# Patient Record
Sex: Female | Born: 1935 | Race: White | Hispanic: No | Smoking: Never smoker
Health system: Southern US, Community
[De-identification: ages and names within clinical notes are randomized; demographics above are authoritative.]

## PROBLEM LIST (undated history)

## (undated) DIAGNOSIS — E039 Hypothyroidism, unspecified: Secondary | ICD-10-CM

## (undated) DIAGNOSIS — I10 Essential (primary) hypertension: Secondary | ICD-10-CM

## (undated) HISTORY — PX: ABDOMINAL HYSTERECTOMY: SHX81

---

## 1998-05-02 ENCOUNTER — Encounter: Payer: Self-pay | Admitting: Obstetrics and Gynecology

## 1998-05-06 ENCOUNTER — Inpatient Hospital Stay (HOSPITAL_COMMUNITY): Admission: RE | Admit: 1998-05-06 | Discharge: 1998-05-09 | Payer: Self-pay | Admitting: Obstetrics and Gynecology

## 1999-10-20 ENCOUNTER — Encounter: Payer: Self-pay | Admitting: Internal Medicine

## 1999-10-20 ENCOUNTER — Encounter: Admission: RE | Admit: 1999-10-20 | Discharge: 1999-10-20 | Payer: Self-pay | Admitting: Internal Medicine

## 2000-10-20 ENCOUNTER — Encounter: Admission: RE | Admit: 2000-10-20 | Discharge: 2000-10-20 | Payer: Self-pay | Admitting: Internal Medicine

## 2000-10-20 ENCOUNTER — Encounter: Payer: Self-pay | Admitting: Internal Medicine

## 2001-10-24 ENCOUNTER — Encounter: Admission: RE | Admit: 2001-10-24 | Discharge: 2001-10-24 | Payer: Self-pay | Admitting: Internal Medicine

## 2001-10-24 ENCOUNTER — Encounter: Payer: Self-pay | Admitting: Internal Medicine

## 2002-10-26 ENCOUNTER — Encounter: Payer: Self-pay | Admitting: Internal Medicine

## 2002-10-26 ENCOUNTER — Encounter: Admission: RE | Admit: 2002-10-26 | Discharge: 2002-10-26 | Payer: Self-pay | Admitting: Internal Medicine

## 2003-04-17 ENCOUNTER — Encounter: Admission: RE | Admit: 2003-04-17 | Discharge: 2003-04-17 | Payer: Self-pay | Admitting: Internal Medicine

## 2003-11-01 ENCOUNTER — Encounter: Admission: RE | Admit: 2003-11-01 | Discharge: 2003-11-01 | Payer: Self-pay | Admitting: Internal Medicine

## 2004-11-09 ENCOUNTER — Encounter: Admission: RE | Admit: 2004-11-09 | Discharge: 2004-11-09 | Payer: Self-pay | Admitting: Internal Medicine

## 2004-11-18 ENCOUNTER — Encounter: Admission: RE | Admit: 2004-11-18 | Discharge: 2004-11-18 | Payer: Self-pay | Admitting: Internal Medicine

## 2005-04-19 ENCOUNTER — Encounter: Admission: RE | Admit: 2005-04-19 | Discharge: 2005-04-19 | Payer: Self-pay | Admitting: Internal Medicine

## 2005-11-17 ENCOUNTER — Encounter: Admission: RE | Admit: 2005-11-17 | Discharge: 2005-11-17 | Payer: Self-pay | Admitting: Internal Medicine

## 2006-11-21 ENCOUNTER — Encounter: Admission: RE | Admit: 2006-11-21 | Discharge: 2006-11-21 | Payer: Self-pay | Admitting: Internal Medicine

## 2007-01-31 ENCOUNTER — Encounter: Admission: RE | Admit: 2007-01-31 | Discharge: 2007-01-31 | Payer: Self-pay | Admitting: Internal Medicine

## 2007-06-05 ENCOUNTER — Encounter: Admission: RE | Admit: 2007-06-05 | Discharge: 2007-06-05 | Payer: Self-pay | Admitting: Internal Medicine

## 2007-07-20 ENCOUNTER — Encounter: Admission: RE | Admit: 2007-07-20 | Discharge: 2007-07-20 | Payer: Self-pay | Admitting: Internal Medicine

## 2007-09-04 ENCOUNTER — Encounter: Admission: RE | Admit: 2007-09-04 | Discharge: 2007-09-04 | Payer: Self-pay | Admitting: Internal Medicine

## 2007-11-21 ENCOUNTER — Encounter: Admission: RE | Admit: 2007-11-21 | Discharge: 2007-11-21 | Payer: Self-pay | Admitting: Internal Medicine

## 2008-11-22 ENCOUNTER — Encounter: Admission: RE | Admit: 2008-11-22 | Discharge: 2008-11-22 | Payer: Self-pay | Admitting: Internal Medicine

## 2009-05-12 ENCOUNTER — Ambulatory Visit (HOSPITAL_BASED_OUTPATIENT_CLINIC_OR_DEPARTMENT_OTHER): Admission: RE | Admit: 2009-05-12 | Discharge: 2009-05-13 | Payer: Self-pay | Admitting: Urology

## 2009-11-26 ENCOUNTER — Encounter: Admission: RE | Admit: 2009-11-26 | Discharge: 2009-11-26 | Payer: Self-pay | Admitting: Internal Medicine

## 2010-09-07 IMAGING — CR DG CHEST 2V
2 series · 2 of 2 positions shown · non-contrast
Comparison: Chest 09/04/2007.

CLINICAL DATA: Preoperative respiratory film for patient with
pelvic prolapse.

CHEST - 2 VIEW

[w chest pa]
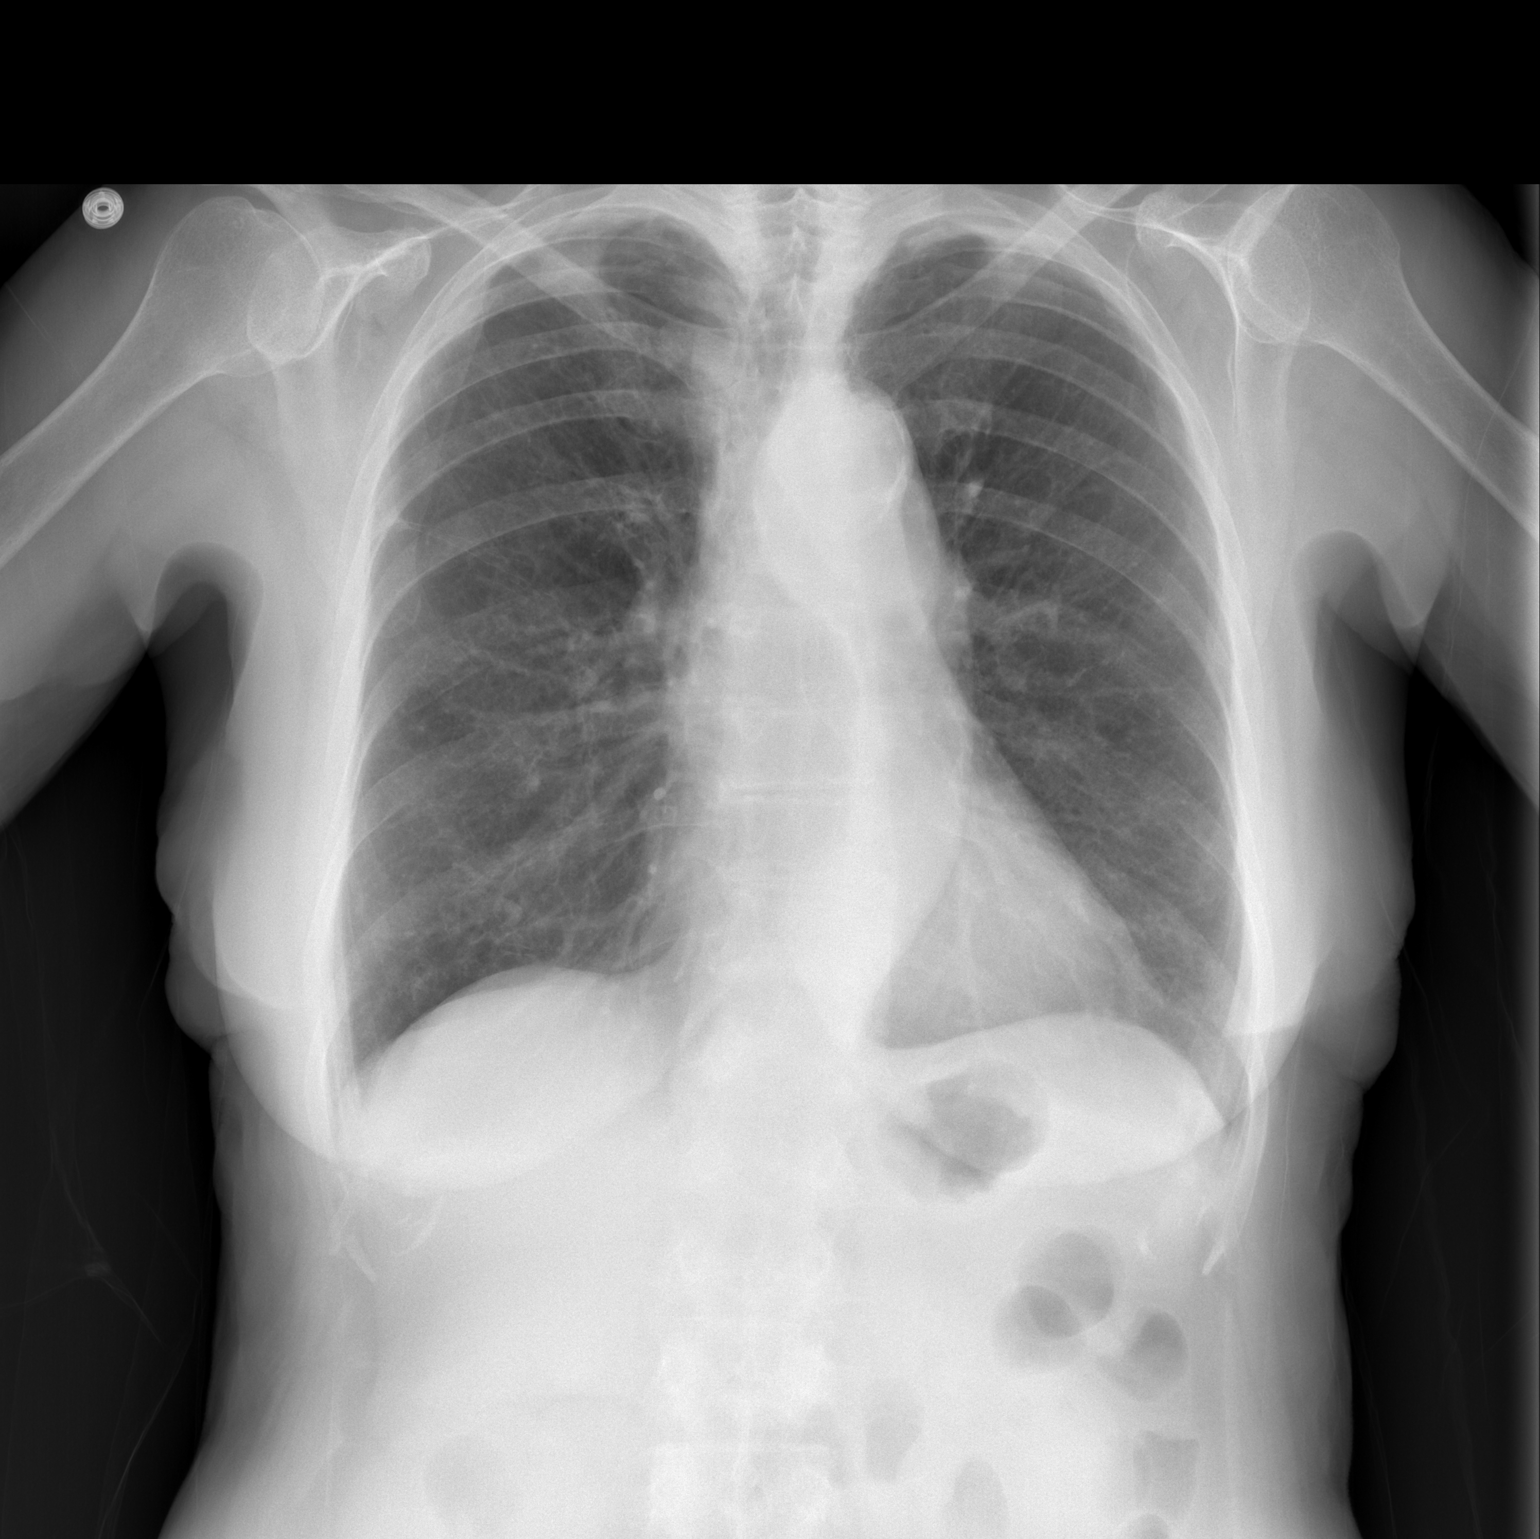

[w chest lat]
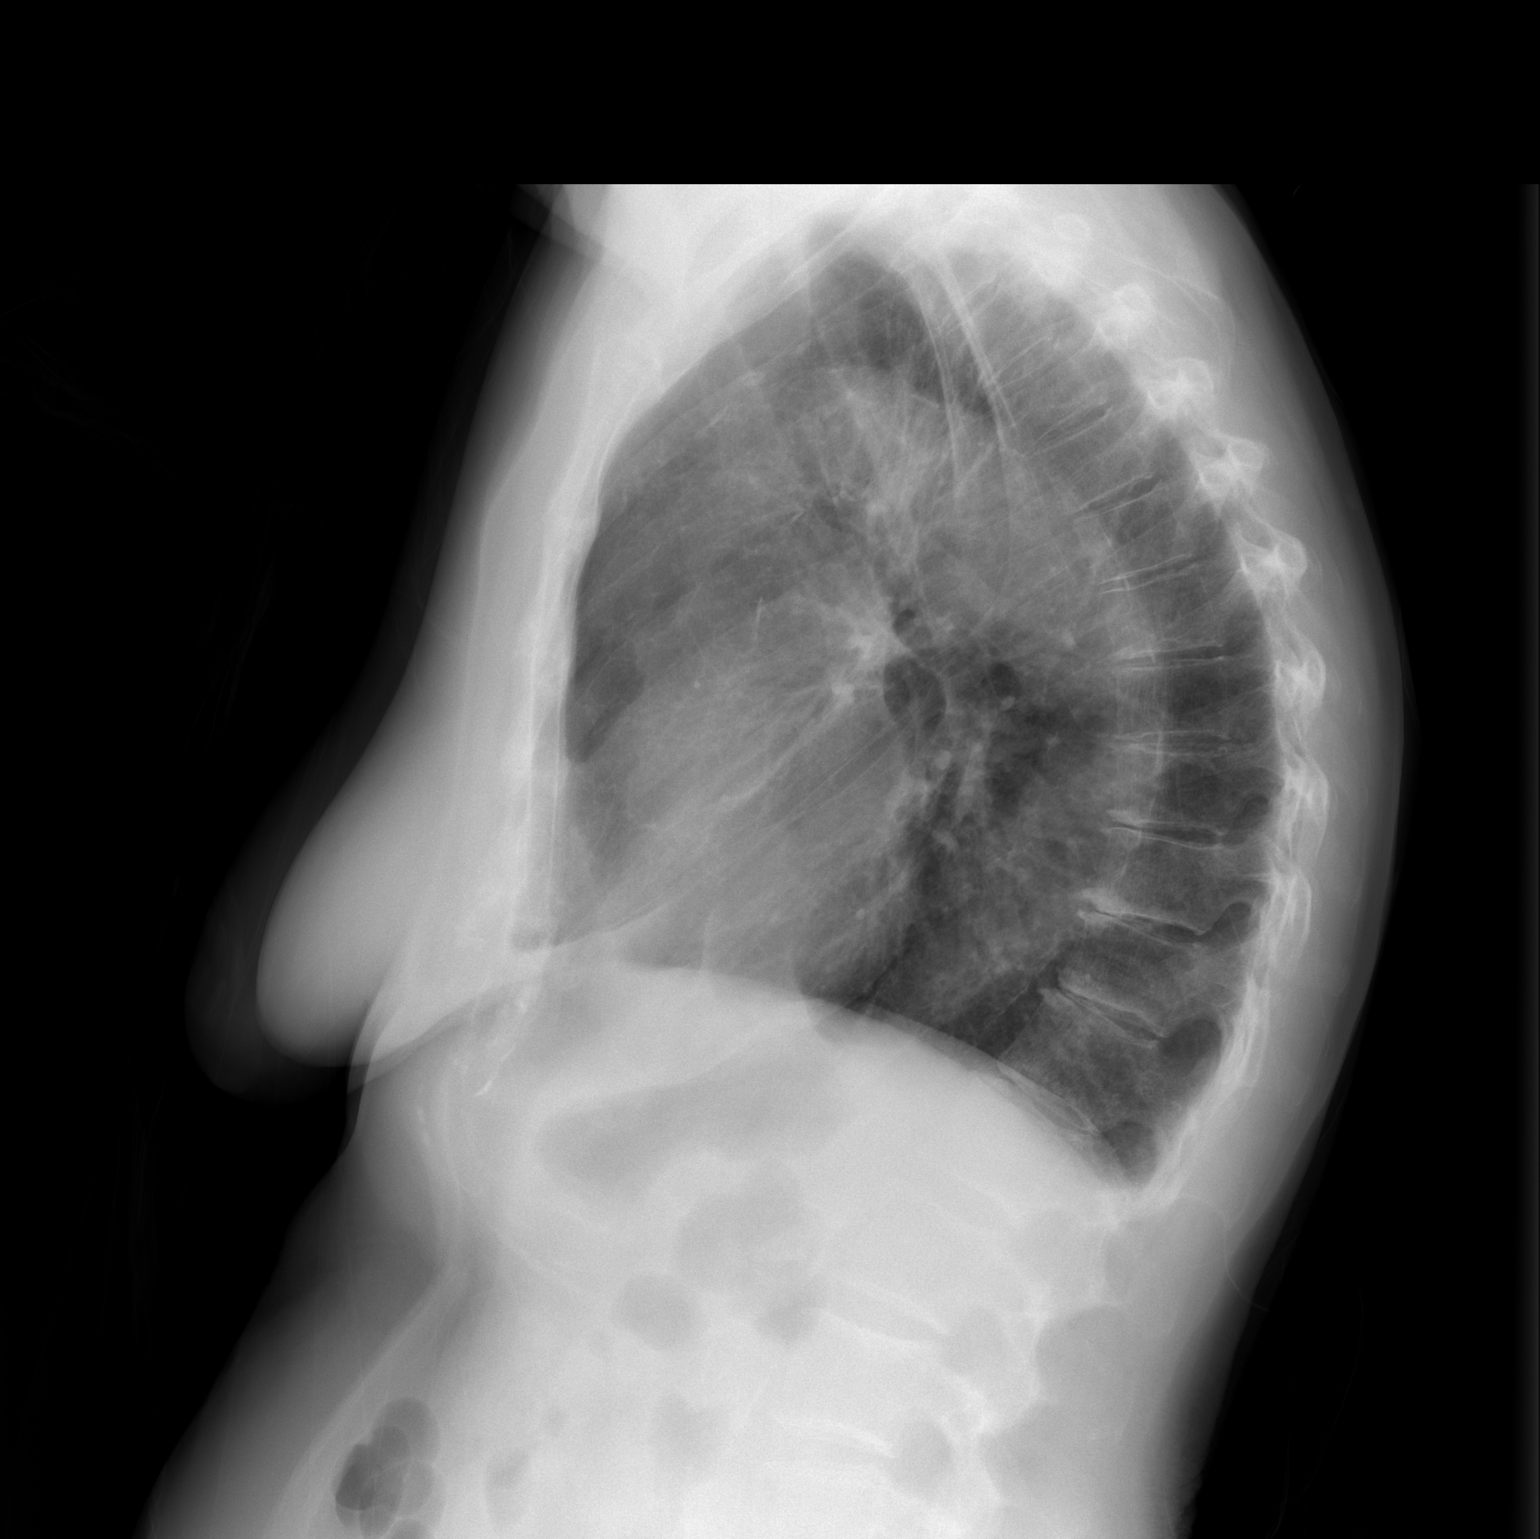

[2 of 2 positions shown; findings below may reference images not displayed]

FINDINGS: There is some biapical pleural and parenchymal scarring
and a small scar in the right upper lobe.  Lungs otherwise clear.
No effusion.  Heart size normal.
IMPRESSION: No acute disease.  Stable compared to prior exam.

## 2010-11-03 ENCOUNTER — Other Ambulatory Visit: Payer: Self-pay | Admitting: Internal Medicine

## 2010-11-03 DIAGNOSIS — Z1231 Encounter for screening mammogram for malignant neoplasm of breast: Secondary | ICD-10-CM

## 2010-11-26 ENCOUNTER — Other Ambulatory Visit: Payer: Self-pay | Admitting: Internal Medicine

## 2010-11-26 DIAGNOSIS — Z780 Asymptomatic menopausal state: Secondary | ICD-10-CM

## 2010-12-02 ENCOUNTER — Ambulatory Visit
Admission: RE | Admit: 2010-12-02 | Discharge: 2010-12-02 | Disposition: A | Discharge status: Home / Self Care | Payer: Medicare Other | Source: Ambulatory Visit | Attending: Internal Medicine | Admitting: Internal Medicine

## 2010-12-02 DIAGNOSIS — Z1231 Encounter for screening mammogram for malignant neoplasm of breast: Secondary | ICD-10-CM

## 2010-12-02 DIAGNOSIS — Z780 Asymptomatic menopausal state: Secondary | ICD-10-CM

## 2011-11-02 ENCOUNTER — Other Ambulatory Visit: Payer: Self-pay | Admitting: Internal Medicine

## 2011-11-02 DIAGNOSIS — Z1231 Encounter for screening mammogram for malignant neoplasm of breast: Secondary | ICD-10-CM

## 2011-12-06 ENCOUNTER — Ambulatory Visit
Admission: RE | Admit: 2011-12-06 | Discharge: 2011-12-06 | Disposition: A | Discharge status: Home / Self Care | Payer: Medicare Other | Source: Ambulatory Visit | Attending: Internal Medicine | Admitting: Internal Medicine

## 2011-12-06 DIAGNOSIS — Z1231 Encounter for screening mammogram for malignant neoplasm of breast: Secondary | ICD-10-CM

## 2012-10-30 ENCOUNTER — Other Ambulatory Visit: Payer: Self-pay

## 2012-10-30 DIAGNOSIS — Z1231 Encounter for screening mammogram for malignant neoplasm of breast: Secondary | ICD-10-CM

## 2012-12-07 ENCOUNTER — Ambulatory Visit
Admission: RE | Admit: 2012-12-07 | Discharge: 2012-12-07 | Disposition: A | Discharge status: Home / Self Care | Payer: Medicare Other | Source: Ambulatory Visit

## 2012-12-07 DIAGNOSIS — Z1231 Encounter for screening mammogram for malignant neoplasm of breast: Secondary | ICD-10-CM

## 2013-10-31 ENCOUNTER — Other Ambulatory Visit: Payer: Self-pay

## 2013-10-31 DIAGNOSIS — Z1231 Encounter for screening mammogram for malignant neoplasm of breast: Secondary | ICD-10-CM

## 2013-12-10 ENCOUNTER — Ambulatory Visit
Admission: RE | Admit: 2013-12-10 | Discharge: 2013-12-10 | Disposition: A | Discharge status: Home / Self Care | Payer: Medicare Other | Source: Ambulatory Visit

## 2013-12-10 DIAGNOSIS — Z1231 Encounter for screening mammogram for malignant neoplasm of breast: Secondary | ICD-10-CM

## 2015-07-16 DIAGNOSIS — H35031 Hypertensive retinopathy, right eye: Secondary | ICD-10-CM | POA: Diagnosis not present

## 2015-07-16 DIAGNOSIS — H35361 Drusen (degenerative) of macula, right eye: Secondary | ICD-10-CM | POA: Diagnosis not present

## 2015-07-16 DIAGNOSIS — H34811 Central retinal vein occlusion, right eye, with macular edema: Secondary | ICD-10-CM | POA: Diagnosis not present

## 2015-07-29 DIAGNOSIS — L821 Other seborrheic keratosis: Secondary | ICD-10-CM | POA: Diagnosis not present

## 2015-07-29 DIAGNOSIS — Z85828 Personal history of other malignant neoplasm of skin: Secondary | ICD-10-CM | POA: Diagnosis not present

## 2015-07-29 DIAGNOSIS — L570 Actinic keratosis: Secondary | ICD-10-CM | POA: Diagnosis not present

## 2015-09-22 DIAGNOSIS — H1851 Endothelial corneal dystrophy: Secondary | ICD-10-CM | POA: Diagnosis not present

## 2015-09-22 DIAGNOSIS — H40013 Open angle with borderline findings, low risk, bilateral: Secondary | ICD-10-CM | POA: Diagnosis not present

## 2015-09-22 DIAGNOSIS — H348122 Central retinal vein occlusion, left eye, stable: Secondary | ICD-10-CM | POA: Diagnosis not present

## 2015-09-23 DIAGNOSIS — I8310 Varicose veins of unspecified lower extremity with inflammation: Secondary | ICD-10-CM | POA: Diagnosis not present

## 2015-09-23 DIAGNOSIS — Z85828 Personal history of other malignant neoplasm of skin: Secondary | ICD-10-CM | POA: Diagnosis not present

## 2015-09-23 DIAGNOSIS — L570 Actinic keratosis: Secondary | ICD-10-CM | POA: Diagnosis not present

## 2015-09-23 DIAGNOSIS — L821 Other seborrheic keratosis: Secondary | ICD-10-CM | POA: Diagnosis not present

## 2015-11-26 DIAGNOSIS — H34811 Central retinal vein occlusion, right eye, with macular edema: Secondary | ICD-10-CM | POA: Diagnosis not present

## 2015-12-09 DIAGNOSIS — H34811 Central retinal vein occlusion, right eye, with macular edema: Secondary | ICD-10-CM | POA: Diagnosis not present

## 2016-02-03 DIAGNOSIS — E038 Other specified hypothyroidism: Secondary | ICD-10-CM | POA: Diagnosis not present

## 2016-02-03 DIAGNOSIS — I10 Essential (primary) hypertension: Secondary | ICD-10-CM | POA: Diagnosis not present

## 2016-02-03 DIAGNOSIS — E784 Other hyperlipidemia: Secondary | ICD-10-CM | POA: Diagnosis not present

## 2016-02-03 DIAGNOSIS — N390 Urinary tract infection, site not specified: Secondary | ICD-10-CM | POA: Diagnosis not present

## 2016-02-03 DIAGNOSIS — M8580 Other specified disorders of bone density and structure, unspecified site: Secondary | ICD-10-CM | POA: Diagnosis not present

## 2016-02-04 DIAGNOSIS — H34811 Central retinal vein occlusion, right eye, with macular edema: Secondary | ICD-10-CM | POA: Diagnosis not present

## 2016-02-04 DIAGNOSIS — H35361 Drusen (degenerative) of macula, right eye: Secondary | ICD-10-CM | POA: Diagnosis not present

## 2016-02-04 DIAGNOSIS — H35421 Microcystoid degeneration of retina, right eye: Secondary | ICD-10-CM | POA: Diagnosis not present

## 2016-02-04 DIAGNOSIS — H43813 Vitreous degeneration, bilateral: Secondary | ICD-10-CM | POA: Diagnosis not present

## 2016-02-11 DIAGNOSIS — E038 Other specified hypothyroidism: Secondary | ICD-10-CM | POA: Diagnosis not present

## 2016-02-11 DIAGNOSIS — M8580 Other specified disorders of bone density and structure, unspecified site: Secondary | ICD-10-CM | POA: Diagnosis not present

## 2016-02-11 DIAGNOSIS — Z0000 Encounter for general adult medical examination without abnormal findings: Secondary | ICD-10-CM | POA: Diagnosis not present

## 2016-02-11 DIAGNOSIS — J342 Deviated nasal septum: Secondary | ICD-10-CM | POA: Diagnosis not present

## 2016-02-11 DIAGNOSIS — I10 Essential (primary) hypertension: Secondary | ICD-10-CM | POA: Diagnosis not present

## 2016-02-11 DIAGNOSIS — I639 Cerebral infarction, unspecified: Secondary | ICD-10-CM | POA: Diagnosis not present

## 2016-02-11 DIAGNOSIS — K589 Irritable bowel syndrome without diarrhea: Secondary | ICD-10-CM | POA: Diagnosis not present

## 2016-02-11 DIAGNOSIS — Z1389 Encounter for screening for other disorder: Secondary | ICD-10-CM | POA: Diagnosis not present

## 2016-02-11 DIAGNOSIS — Z6821 Body mass index (BMI) 21.0-21.9, adult: Secondary | ICD-10-CM | POA: Diagnosis not present

## 2016-02-11 DIAGNOSIS — E784 Other hyperlipidemia: Secondary | ICD-10-CM | POA: Diagnosis not present

## 2016-02-16 DIAGNOSIS — H40013 Open angle with borderline findings, low risk, bilateral: Secondary | ICD-10-CM | POA: Diagnosis not present

## 2016-02-16 DIAGNOSIS — H1851 Endothelial corneal dystrophy: Secondary | ICD-10-CM | POA: Diagnosis not present

## 2016-02-16 DIAGNOSIS — H348122 Central retinal vein occlusion, left eye, stable: Secondary | ICD-10-CM | POA: Diagnosis not present

## 2016-04-13 DIAGNOSIS — H35421 Microcystoid degeneration of retina, right eye: Secondary | ICD-10-CM | POA: Diagnosis not present

## 2016-04-13 DIAGNOSIS — H35361 Drusen (degenerative) of macula, right eye: Secondary | ICD-10-CM | POA: Diagnosis not present

## 2016-04-13 DIAGNOSIS — H43813 Vitreous degeneration, bilateral: Secondary | ICD-10-CM | POA: Diagnosis not present

## 2016-04-13 DIAGNOSIS — H34811 Central retinal vein occlusion, right eye, with macular edema: Secondary | ICD-10-CM | POA: Diagnosis not present

## 2016-07-07 DIAGNOSIS — H43813 Vitreous degeneration, bilateral: Secondary | ICD-10-CM | POA: Diagnosis not present

## 2016-07-07 DIAGNOSIS — H34812 Central retinal vein occlusion, left eye, with macular edema: Secondary | ICD-10-CM | POA: Diagnosis not present

## 2016-07-07 DIAGNOSIS — H35361 Drusen (degenerative) of macula, right eye: Secondary | ICD-10-CM | POA: Diagnosis not present

## 2016-07-07 DIAGNOSIS — H3562 Retinal hemorrhage, left eye: Secondary | ICD-10-CM | POA: Diagnosis not present

## 2016-08-09 DIAGNOSIS — H348122 Central retinal vein occlusion, left eye, stable: Secondary | ICD-10-CM | POA: Diagnosis not present

## 2016-08-09 DIAGNOSIS — H1851 Endothelial corneal dystrophy: Secondary | ICD-10-CM | POA: Diagnosis not present

## 2016-08-09 DIAGNOSIS — H40013 Open angle with borderline findings, low risk, bilateral: Secondary | ICD-10-CM | POA: Diagnosis not present

## 2016-08-09 DIAGNOSIS — H524 Presbyopia: Secondary | ICD-10-CM | POA: Diagnosis not present

## 2016-08-10 DIAGNOSIS — M1990 Unspecified osteoarthritis, unspecified site: Secondary | ICD-10-CM | POA: Diagnosis not present

## 2016-08-10 DIAGNOSIS — M859 Disorder of bone density and structure, unspecified: Secondary | ICD-10-CM | POA: Diagnosis not present

## 2016-08-10 DIAGNOSIS — I10 Essential (primary) hypertension: Secondary | ICD-10-CM | POA: Diagnosis not present

## 2016-08-10 DIAGNOSIS — N183 Chronic kidney disease, stage 3 (moderate): Secondary | ICD-10-CM | POA: Diagnosis not present

## 2016-08-10 DIAGNOSIS — E038 Other specified hypothyroidism: Secondary | ICD-10-CM | POA: Diagnosis not present

## 2016-09-28 DIAGNOSIS — D225 Melanocytic nevi of trunk: Secondary | ICD-10-CM | POA: Diagnosis not present

## 2016-09-28 DIAGNOSIS — D1800 Hemangioma unspecified site: Secondary | ICD-10-CM | POA: Diagnosis not present

## 2016-09-28 DIAGNOSIS — L814 Other melanin hyperpigmentation: Secondary | ICD-10-CM | POA: Diagnosis not present

## 2016-09-28 DIAGNOSIS — L821 Other seborrheic keratosis: Secondary | ICD-10-CM | POA: Diagnosis not present

## 2016-10-13 DIAGNOSIS — H35363 Drusen (degenerative) of macula, bilateral: Secondary | ICD-10-CM | POA: Diagnosis not present

## 2016-10-13 DIAGNOSIS — H35421 Microcystoid degeneration of retina, right eye: Secondary | ICD-10-CM | POA: Diagnosis not present

## 2016-10-13 DIAGNOSIS — H34812 Central retinal vein occlusion, left eye, with macular edema: Secondary | ICD-10-CM | POA: Diagnosis not present

## 2016-10-13 DIAGNOSIS — H43813 Vitreous degeneration, bilateral: Secondary | ICD-10-CM | POA: Diagnosis not present

## 2017-02-11 DIAGNOSIS — E038 Other specified hypothyroidism: Secondary | ICD-10-CM | POA: Diagnosis not present

## 2017-02-11 DIAGNOSIS — M859 Disorder of bone density and structure, unspecified: Secondary | ICD-10-CM | POA: Diagnosis not present

## 2017-02-11 DIAGNOSIS — I10 Essential (primary) hypertension: Secondary | ICD-10-CM | POA: Diagnosis not present

## 2017-02-11 DIAGNOSIS — E784 Other hyperlipidemia: Secondary | ICD-10-CM | POA: Diagnosis not present

## 2017-02-16 DIAGNOSIS — H43813 Vitreous degeneration, bilateral: Secondary | ICD-10-CM | POA: Diagnosis not present

## 2017-02-16 DIAGNOSIS — H348112 Central retinal vein occlusion, right eye, stable: Secondary | ICD-10-CM | POA: Diagnosis not present

## 2017-02-16 DIAGNOSIS — H3582 Retinal ischemia: Secondary | ICD-10-CM | POA: Diagnosis not present

## 2017-02-18 DIAGNOSIS — E784 Other hyperlipidemia: Secondary | ICD-10-CM | POA: Diagnosis not present

## 2017-02-18 DIAGNOSIS — M859 Disorder of bone density and structure, unspecified: Secondary | ICD-10-CM | POA: Diagnosis not present

## 2017-02-18 DIAGNOSIS — Z0000 Encounter for general adult medical examination without abnormal findings: Secondary | ICD-10-CM | POA: Diagnosis not present

## 2017-02-18 DIAGNOSIS — N183 Chronic kidney disease, stage 3 (moderate): Secondary | ICD-10-CM | POA: Diagnosis not present

## 2017-02-18 DIAGNOSIS — Z23 Encounter for immunization: Secondary | ICD-10-CM | POA: Diagnosis not present

## 2017-07-05 DIAGNOSIS — Z6820 Body mass index (BMI) 20.0-20.9, adult: Secondary | ICD-10-CM | POA: Diagnosis not present

## 2017-07-05 DIAGNOSIS — K589 Irritable bowel syndrome without diarrhea: Secondary | ICD-10-CM | POA: Diagnosis not present

## 2017-08-15 DIAGNOSIS — H34812 Central retinal vein occlusion, left eye, with macular edema: Secondary | ICD-10-CM | POA: Diagnosis not present

## 2017-08-15 DIAGNOSIS — H40013 Open angle with borderline findings, low risk, bilateral: Secondary | ICD-10-CM | POA: Diagnosis not present

## 2017-08-15 DIAGNOSIS — H1851 Endothelial corneal dystrophy: Secondary | ICD-10-CM | POA: Diagnosis not present

## 2017-08-17 DIAGNOSIS — H35363 Drusen (degenerative) of macula, bilateral: Secondary | ICD-10-CM | POA: Diagnosis not present

## 2017-08-17 DIAGNOSIS — H34812 Central retinal vein occlusion, left eye, with macular edema: Secondary | ICD-10-CM | POA: Diagnosis not present

## 2017-08-17 DIAGNOSIS — H35421 Microcystoid degeneration of retina, right eye: Secondary | ICD-10-CM | POA: Diagnosis not present

## 2017-08-17 DIAGNOSIS — H3562 Retinal hemorrhage, left eye: Secondary | ICD-10-CM | POA: Diagnosis not present

## 2017-08-24 DIAGNOSIS — H349 Unspecified retinal vascular occlusion: Secondary | ICD-10-CM | POA: Diagnosis not present

## 2017-08-24 DIAGNOSIS — K589 Irritable bowel syndrome without diarrhea: Secondary | ICD-10-CM | POA: Diagnosis not present

## 2017-08-24 DIAGNOSIS — I10 Essential (primary) hypertension: Secondary | ICD-10-CM | POA: Diagnosis not present

## 2017-08-24 DIAGNOSIS — R634 Abnormal weight loss: Secondary | ICD-10-CM | POA: Diagnosis not present

## 2017-08-31 DIAGNOSIS — H34812 Central retinal vein occlusion, left eye, with macular edema: Secondary | ICD-10-CM | POA: Diagnosis not present

## 2017-10-26 DIAGNOSIS — H35421 Microcystoid degeneration of retina, right eye: Secondary | ICD-10-CM | POA: Diagnosis not present

## 2017-10-26 DIAGNOSIS — H35363 Drusen (degenerative) of macula, bilateral: Secondary | ICD-10-CM | POA: Diagnosis not present

## 2017-10-26 DIAGNOSIS — H3562 Retinal hemorrhage, left eye: Secondary | ICD-10-CM | POA: Diagnosis not present

## 2017-10-26 DIAGNOSIS — H34812 Central retinal vein occlusion, left eye, with macular edema: Secondary | ICD-10-CM | POA: Diagnosis not present

## 2017-12-21 DIAGNOSIS — H34812 Central retinal vein occlusion, left eye, with macular edema: Secondary | ICD-10-CM | POA: Diagnosis not present

## 2017-12-21 DIAGNOSIS — H43813 Vitreous degeneration, bilateral: Secondary | ICD-10-CM | POA: Diagnosis not present

## 2017-12-21 DIAGNOSIS — H35421 Microcystoid degeneration of retina, right eye: Secondary | ICD-10-CM | POA: Diagnosis not present

## 2017-12-21 DIAGNOSIS — H35363 Drusen (degenerative) of macula, bilateral: Secondary | ICD-10-CM | POA: Diagnosis not present

## 2018-02-13 DIAGNOSIS — R82998 Other abnormal findings in urine: Secondary | ICD-10-CM | POA: Diagnosis not present

## 2018-02-13 DIAGNOSIS — E038 Other specified hypothyroidism: Secondary | ICD-10-CM | POA: Diagnosis not present

## 2018-02-13 DIAGNOSIS — I10 Essential (primary) hypertension: Secondary | ICD-10-CM | POA: Diagnosis not present

## 2018-02-13 DIAGNOSIS — M859 Disorder of bone density and structure, unspecified: Secondary | ICD-10-CM | POA: Diagnosis not present

## 2018-02-13 DIAGNOSIS — E7849 Other hyperlipidemia: Secondary | ICD-10-CM | POA: Diagnosis not present

## 2018-02-20 DIAGNOSIS — N390 Urinary tract infection, site not specified: Secondary | ICD-10-CM | POA: Diagnosis not present

## 2018-02-20 DIAGNOSIS — I10 Essential (primary) hypertension: Secondary | ICD-10-CM | POA: Diagnosis not present

## 2018-02-20 DIAGNOSIS — Z23 Encounter for immunization: Secondary | ICD-10-CM | POA: Diagnosis not present

## 2018-02-20 DIAGNOSIS — J328 Other chronic sinusitis: Secondary | ICD-10-CM | POA: Diagnosis not present

## 2018-02-20 DIAGNOSIS — Z0000 Encounter for general adult medical examination without abnormal findings: Secondary | ICD-10-CM | POA: Diagnosis not present

## 2018-02-21 DIAGNOSIS — Z1212 Encounter for screening for malignant neoplasm of rectum: Secondary | ICD-10-CM | POA: Diagnosis not present

## 2018-02-22 DIAGNOSIS — H3562 Retinal hemorrhage, left eye: Secondary | ICD-10-CM | POA: Diagnosis not present

## 2018-02-22 DIAGNOSIS — H3582 Retinal ischemia: Secondary | ICD-10-CM | POA: Diagnosis not present

## 2018-02-22 DIAGNOSIS — H34812 Central retinal vein occlusion, left eye, with macular edema: Secondary | ICD-10-CM | POA: Diagnosis not present

## 2018-02-22 DIAGNOSIS — H35363 Drusen (degenerative) of macula, bilateral: Secondary | ICD-10-CM | POA: Diagnosis not present

## 2018-05-03 DIAGNOSIS — H3582 Retinal ischemia: Secondary | ICD-10-CM | POA: Diagnosis not present

## 2018-05-03 DIAGNOSIS — H34812 Central retinal vein occlusion, left eye, with macular edema: Secondary | ICD-10-CM | POA: Diagnosis not present

## 2018-05-03 DIAGNOSIS — H35363 Drusen (degenerative) of macula, bilateral: Secondary | ICD-10-CM | POA: Diagnosis not present

## 2018-05-03 DIAGNOSIS — H3562 Retinal hemorrhage, left eye: Secondary | ICD-10-CM | POA: Diagnosis not present

## 2018-07-11 DIAGNOSIS — E7849 Other hyperlipidemia: Secondary | ICD-10-CM | POA: Diagnosis not present

## 2018-07-11 DIAGNOSIS — N183 Chronic kidney disease, stage 3 (moderate): Secondary | ICD-10-CM | POA: Diagnosis not present

## 2018-07-11 DIAGNOSIS — I10 Essential (primary) hypertension: Secondary | ICD-10-CM | POA: Diagnosis not present

## 2018-07-11 DIAGNOSIS — D692 Other nonthrombocytopenic purpura: Secondary | ICD-10-CM | POA: Diagnosis not present

## 2018-07-19 DIAGNOSIS — H34812 Central retinal vein occlusion, left eye, with macular edema: Secondary | ICD-10-CM | POA: Diagnosis not present

## 2018-07-19 DIAGNOSIS — H3582 Retinal ischemia: Secondary | ICD-10-CM | POA: Diagnosis not present

## 2018-07-19 DIAGNOSIS — H35363 Drusen (degenerative) of macula, bilateral: Secondary | ICD-10-CM | POA: Diagnosis not present

## 2018-07-19 DIAGNOSIS — H35421 Microcystoid degeneration of retina, right eye: Secondary | ICD-10-CM | POA: Diagnosis not present

## 2018-09-27 DIAGNOSIS — H903 Sensorineural hearing loss, bilateral: Secondary | ICD-10-CM | POA: Diagnosis not present

## 2018-09-27 DIAGNOSIS — R040 Epistaxis: Secondary | ICD-10-CM | POA: Diagnosis not present

## 2018-10-11 DIAGNOSIS — H34812 Central retinal vein occlusion, left eye, with macular edema: Secondary | ICD-10-CM | POA: Diagnosis not present

## 2018-11-27 DIAGNOSIS — H524 Presbyopia: Secondary | ICD-10-CM | POA: Diagnosis not present

## 2018-11-27 DIAGNOSIS — H1851 Endothelial corneal dystrophy: Secondary | ICD-10-CM | POA: Diagnosis not present

## 2018-11-27 DIAGNOSIS — H40023 Open angle with borderline findings, high risk, bilateral: Secondary | ICD-10-CM | POA: Diagnosis not present

## 2018-11-27 DIAGNOSIS — H348122 Central retinal vein occlusion, left eye, stable: Secondary | ICD-10-CM | POA: Diagnosis not present

## 2019-01-03 DIAGNOSIS — H3582 Retinal ischemia: Secondary | ICD-10-CM | POA: Diagnosis not present

## 2019-01-03 DIAGNOSIS — H3562 Retinal hemorrhage, left eye: Secondary | ICD-10-CM | POA: Diagnosis not present

## 2019-01-03 DIAGNOSIS — H43813 Vitreous degeneration, bilateral: Secondary | ICD-10-CM | POA: Diagnosis not present

## 2019-01-03 DIAGNOSIS — H34812 Central retinal vein occlusion, left eye, with macular edema: Secondary | ICD-10-CM | POA: Diagnosis not present

## 2019-02-01 ENCOUNTER — Inpatient Hospital Stay (HOSPITAL_COMMUNITY): Payer: Medicare Other

## 2019-02-01 ENCOUNTER — Other Ambulatory Visit: Payer: Self-pay

## 2019-02-01 ENCOUNTER — Emergency Department (HOSPITAL_COMMUNITY): Payer: Medicare Other

## 2019-02-01 ENCOUNTER — Inpatient Hospital Stay (HOSPITAL_COMMUNITY)
Admission: EM | Admit: 2019-02-01 | Discharge: 2019-02-02 | DRG: 194 | Disposition: A | Discharge status: Hospice | Payer: Medicare Other | Attending: Internal Medicine | Admitting: Internal Medicine

## 2019-02-01 ENCOUNTER — Encounter (HOSPITAL_COMMUNITY): Payer: Self-pay

## 2019-02-01 DIAGNOSIS — R0901 Asphyxia: Secondary | ICD-10-CM | POA: Diagnosis not present

## 2019-02-01 DIAGNOSIS — Z20828 Contact with and (suspected) exposure to other viral communicable diseases: Secondary | ICD-10-CM | POA: Diagnosis not present

## 2019-02-01 DIAGNOSIS — I513 Intracardiac thrombosis, not elsewhere classified: Secondary | ICD-10-CM | POA: Diagnosis not present

## 2019-02-01 DIAGNOSIS — E039 Hypothyroidism, unspecified: Secondary | ICD-10-CM | POA: Diagnosis not present

## 2019-02-01 DIAGNOSIS — R918 Other nonspecific abnormal finding of lung field: Secondary | ICD-10-CM | POA: Diagnosis present

## 2019-02-01 DIAGNOSIS — J189 Pneumonia, unspecified organism: Principal | ICD-10-CM

## 2019-02-01 DIAGNOSIS — Z79899 Other long term (current) drug therapy: Secondary | ICD-10-CM

## 2019-02-01 DIAGNOSIS — J440 Chronic obstructive pulmonary disease with acute lower respiratory infection: Secondary | ICD-10-CM | POA: Diagnosis not present

## 2019-02-01 DIAGNOSIS — J90 Pleural effusion, not elsewhere classified: Secondary | ICD-10-CM | POA: Diagnosis not present

## 2019-02-01 DIAGNOSIS — Z79890 Hormone replacement therapy (postmenopausal): Secondary | ICD-10-CM

## 2019-02-01 DIAGNOSIS — I700 Atherosclerosis of aorta: Secondary | ICD-10-CM | POA: Diagnosis present

## 2019-02-01 DIAGNOSIS — Z7951 Long term (current) use of inhaled steroids: Secondary | ICD-10-CM

## 2019-02-01 DIAGNOSIS — R0902 Hypoxemia: Secondary | ICD-10-CM | POA: Diagnosis not present

## 2019-02-01 DIAGNOSIS — I2510 Atherosclerotic heart disease of native coronary artery without angina pectoris: Secondary | ICD-10-CM | POA: Diagnosis not present

## 2019-02-01 DIAGNOSIS — I129 Hypertensive chronic kidney disease with stage 1 through stage 4 chronic kidney disease, or unspecified chronic kidney disease: Secondary | ICD-10-CM | POA: Diagnosis not present

## 2019-02-01 DIAGNOSIS — R000 Tachycardia, unspecified: Secondary | ICD-10-CM | POA: Diagnosis not present

## 2019-02-01 DIAGNOSIS — R64 Cachexia: Secondary | ICD-10-CM | POA: Diagnosis not present

## 2019-02-01 DIAGNOSIS — Z681 Body mass index (BMI) 19 or less, adult: Secondary | ICD-10-CM | POA: Diagnosis not present

## 2019-02-01 DIAGNOSIS — Z7982 Long term (current) use of aspirin: Secondary | ICD-10-CM | POA: Diagnosis not present

## 2019-02-01 DIAGNOSIS — I10 Essential (primary) hypertension: Secondary | ICD-10-CM | POA: Diagnosis present

## 2019-02-01 DIAGNOSIS — I77810 Thoracic aortic ectasia: Secondary | ICD-10-CM | POA: Diagnosis not present

## 2019-02-01 DIAGNOSIS — Z9981 Dependence on supplemental oxygen: Secondary | ICD-10-CM

## 2019-02-01 DIAGNOSIS — R0609 Other forms of dyspnea: Secondary | ICD-10-CM | POA: Diagnosis not present

## 2019-02-01 HISTORY — DX: Hypothyroidism, unspecified: E03.9

## 2019-02-01 HISTORY — DX: Essential (primary) hypertension: I10

## 2019-02-01 LAB — CBC WITH DIFFERENTIAL/PLATELET
Abs Immature Granulocytes: 0.03 10*3/uL (ref 0.00–0.07)
Basophils Absolute: 0.1 10*3/uL (ref 0.0–0.1)
Basophils Relative: 1 %
Eosinophils Absolute: 0.3 10*3/uL (ref 0.0–0.5)
Eosinophils Relative: 4 %
HCT: 43.4 % (ref 36.0–46.0)
Hemoglobin: 14 g/dL (ref 12.0–15.0)
Immature Granulocytes: 0 %
Lymphocytes Relative: 19 %
Lymphs Abs: 1.4 10*3/uL (ref 0.7–4.0)
MCH: 30.6 pg (ref 26.0–34.0)
MCHC: 32.3 g/dL (ref 30.0–36.0)
MCV: 95 fL (ref 80.0–100.0)
Monocytes Absolute: 0.5 10*3/uL (ref 0.1–1.0)
Monocytes Relative: 6 %
Neutro Abs: 5.3 10*3/uL (ref 1.7–7.7)
Neutrophils Relative %: 70 %
Platelets: 236 10*3/uL (ref 150–400)
RBC: 4.57 MIL/uL (ref 3.87–5.11)
RDW: 14.5 % (ref 11.5–15.5)
WBC: 7.5 10*3/uL (ref 4.0–10.5)
nRBC: 0 % (ref 0.0–0.2)

## 2019-02-01 LAB — COMPREHENSIVE METABOLIC PANEL
ALT: 19 U/L (ref 0–44)
AST: 33 U/L (ref 15–41)
Albumin: 3.5 g/dL (ref 3.5–5.0)
Alkaline Phosphatase: 30 U/L — ABNORMAL LOW (ref 38–126)
Anion gap: 12 (ref 5–15)
BUN: 24 mg/dL — ABNORMAL HIGH (ref 8–23)
CO2: 28 mmol/L (ref 22–32)
Calcium: 9.5 mg/dL (ref 8.9–10.3)
Chloride: 100 mmol/L (ref 98–111)
Creatinine, Ser: 1.19 mg/dL — ABNORMAL HIGH (ref 0.44–1.00)
GFR calc Af Amer: 48 mL/min — ABNORMAL LOW (ref >60)
GFR calc non Af Amer: 41 mL/min — ABNORMAL LOW (ref >60)
Glucose, Bld: 121 mg/dL — ABNORMAL HIGH (ref 70–99)
Potassium: 4.5 mmol/L (ref 3.5–5.1)
Sodium: 140 mmol/L (ref 135–145)
Total Bilirubin: 0.8 mg/dL (ref 0.3–1.2)
Total Protein: 6.6 g/dL (ref 6.5–8.1)

## 2019-02-01 LAB — D-DIMER, QUANTITATIVE: D-Dimer, Quant: 3.35 ug/mL-FEU — ABNORMAL HIGH (ref 0.00–0.50)

## 2019-02-01 LAB — LACTIC ACID, PLASMA
Lactic Acid, Venous: 1.8 mmol/L (ref 0.5–1.9)
Lactic Acid, Venous: 1.4 mmol/L (ref 0.5–1.9)

## 2019-02-01 LAB — SARS CORONAVIRUS 2 BY RT PCR (HOSPITAL ORDER, PERFORMED IN ~~LOC~~ HOSPITAL LAB): SARS Coronavirus 2: NEGATIVE

## 2019-02-01 LAB — TROPONIN I (HIGH SENSITIVITY)
Troponin I (High Sensitivity): 22 ng/L — ABNORMAL HIGH (ref <18)
Troponin I (High Sensitivity): 30 ng/L — ABNORMAL HIGH (ref <18)

## 2019-02-01 LAB — PROCALCITONIN: Procalcitonin: <0.1 ng/mL

## 2019-02-01 MED ORDER — CLORAZEPATE DIPOTASSIUM 3.75 MG PO TABS: 3.7500 mg | ORAL_TABLET | Freq: Every evening | ORAL | Status: DC | PRN

## 2019-02-01 MED ORDER — AMLODIPINE BESYLATE 5 MG PO TABS
5.0000 mg | ORAL_TABLET | Freq: Every day | ORAL | Status: DC
  Administered 2019-02-02: 5 mg via ORAL
  Filled 2019-02-01: qty 1

## 2019-02-01 MED ORDER — TOBRAMYCIN 0.3 % OP SOLN
1.0000 [drp] | Freq: Four times a day (QID) | OPHTHALMIC | Status: DC | PRN
  Filled 2019-02-01: qty 5

## 2019-02-01 MED ORDER — DIGESTIVE ENZYME PO CAPS: 1.0000 | ORAL_CAPSULE | Freq: Every day | ORAL | Status: DC

## 2019-02-01 MED ORDER — ASPIRIN EC 81 MG PO TBEC
81.0000 mg | DELAYED_RELEASE_TABLET | Freq: Every day | ORAL | Status: DC
  Administered 2019-02-02: 81 mg via ORAL
  Filled 2019-02-01: qty 1

## 2019-02-01 MED ORDER — ALBUTEROL SULFATE (2.5 MG/3ML) 0.083% IN NEBU
2.5000 mg | INHALATION_SOLUTION | Freq: Four times a day (QID) | RESPIRATORY_TRACT | Status: DC
  Administered 2019-02-01: 2.5 mg via RESPIRATORY_TRACT
  Filled 2019-02-01: qty 3

## 2019-02-01 MED ORDER — IPRATROPIUM BROMIDE 0.02 % IN SOLN
0.5000 mg | Freq: Four times a day (QID) | RESPIRATORY_TRACT | Status: DC
  Administered 2019-02-01: 0.5 mg via RESPIRATORY_TRACT
  Filled 2019-02-01: qty 2.5

## 2019-02-01 MED ORDER — ALBUTEROL SULFATE (2.5 MG/3ML) 0.083% IN NEBU: 2.5000 mg | INHALATION_SOLUTION | RESPIRATORY_TRACT | Status: DC | PRN

## 2019-02-01 MED ORDER — LEVOTHYROXINE SODIUM 75 MCG PO TABS
75.0000 ug | ORAL_TABLET | Freq: Every day | ORAL | Status: DC
  Administered 2019-02-02: 75 ug via ORAL
  Filled 2019-02-01: qty 1

## 2019-02-01 MED ORDER — IOHEXOL 300 MG/ML  SOLN
75.0000 mL | Freq: Once | INTRAMUSCULAR | Status: AC | PRN
  Administered 2019-02-01: 75 mL via INTRAVENOUS

## 2019-02-01 MED ORDER — SODIUM CHLORIDE 0.9 % IV SOLN: 250.0000 mL | INTRAVENOUS | Status: DC | PRN

## 2019-02-01 MED ORDER — ENSURE ENLIVE PO LIQD
237.0000 mL | Freq: Two times a day (BID) | ORAL | Status: DC
  Administered 2019-02-02: 237 mL via ORAL

## 2019-02-01 MED ORDER — SODIUM CHLORIDE 0.9% FLUSH
3.0000 mL | Freq: Two times a day (BID) | INTRAVENOUS | Status: DC
  Administered 2019-02-01 – 2019-02-02 (×2): 3 mL via INTRAVENOUS

## 2019-02-01 MED ORDER — SODIUM CHLORIDE 0.9 % IV SOLN: 2.0000 g | INTRAVENOUS | Status: DC

## 2019-02-01 MED ORDER — SODIUM CHLORIDE 0.9 % IV BOLUS
1000.0000 mL | Freq: Once | INTRAVENOUS | Status: AC
  Administered 2019-02-01: 1000 mL via INTRAVENOUS

## 2019-02-01 MED ORDER — SODIUM CHLORIDE 0.9% FLUSH: 3.0000 mL | INTRAVENOUS | Status: DC | PRN

## 2019-02-01 MED ORDER — HEPARIN SODIUM (PORCINE) 5000 UNIT/ML IJ SOLN
5000.0000 [IU] | Freq: Three times a day (TID) | INTRAMUSCULAR | Status: DC
  Administered 2019-02-01 – 2019-02-02 (×2): 5000 [IU] via SUBCUTANEOUS
  Filled 2019-02-01 (×2): qty 1

## 2019-02-01 MED ORDER — GUAIFENESIN ER 600 MG PO TB12
600.0000 mg | ORAL_TABLET | Freq: Two times a day (BID) | ORAL | Status: DC
  Administered 2019-02-01 – 2019-02-02 (×2): 600 mg via ORAL
  Filled 2019-02-01 (×2): qty 1

## 2019-02-01 MED ORDER — SODIUM CHLORIDE 0.9 % IV SOLN
2.0000 g | Freq: Once | INTRAVENOUS | Status: AC
  Administered 2019-02-01: 2 g via INTRAVENOUS
  Filled 2019-02-01: qty 2

## 2019-02-01 NOTE — ED Provider Notes (Signed)
[facility] EMERGENCY DEPARTMENT Provider Note   CSN: [id number] Arrival date & time: 02/01/19  1137     History   Chief Complaint Chief Complaint  Patient presents with   Shortness of Breath    HPI Christine Cuevas is a 85 y.o. female presents to the ED from PCP office.  Patient reports that she has felt short of breath for the last 2 weeks.  She was initially called in a Z-Pak by PCP on 8/21 with mild improvement.  Patient states she went to her PCPs office today for further evaluation and was noted to be hypoxic with an oxygen saturation of 88% on room air.  Arrival to the ED patient saturation was 94% but dropped quickly to 82% on room air.  Patient was placed on 2 L nasal cannula with improvement.  He does not typically require oxygen at home.  States that in December she had a "virus" and has not been the same since.  She states that she had 3 rounds of antibiotics at that time with mild improvement but still has not fully gone away.  She states that 2 weeks ago she became more short of breath prompting her to follow-up with her PCP.  No other complaints at this time.  Denies fever, chills, chest pain, abdominal pain, nausea, vomiting, urinary symptoms, any other associated symptoms.  No history of DVT/PE.  She is not anticoagulated.  No known COVID-19 positive exposure.  Patient has not been tested for COVID-19.      The history is provided by the patient and a relative.    History reviewed. No pertinent past medical history.  There are no active problems to display for this patient.   History reviewed. No pertinent surgical history.   OB History   No obstetric history on file.      Home Medications    Prior to Admission medications   Medication Sig Start Date End Date Taking? Authorizing Provider  albuterol (VENTOLIN HFA) 108 (90 Base) MCG/ACT inhaler Inhale 1-2 puffs into the lungs every 4 (four) hours as needed for wheezing or shortness of breath.    Yes [provider]  amLODipine (NORVASC) 5 MG tablet Take 5 mg by mouth daily.   Yes [provider]  Ascorbic Acid (VITAMIN C WITH ROSE HIPS) 500 MG tablet Take 500 mg by mouth daily.   Yes [provider]  aspirin (ASPIRIN 81) 81 MG EC tablet Take 81 mg by mouth daily.   Yes [provider]  calcium carbonate (CALCIUM 600) 1500 (600 Ca) MG TABS tablet Take 1,500 mg by mouth daily.   Yes [provider]  carboxymethylcellulose (REFRESH PLUS) 0.5 % SOLN Place 1 drop into both eyes 2 (two) times daily as needed (dry eyes).   Yes [provider]  clorazepate (TRANXENE) 3.75 MG tablet Take 3.75 mg by mouth daily as needed for anxiety.   Yes [provider]  Digestive Enzyme CAPS Take 1 capsule by mouth daily.   Yes [provider]  fluticasone (FLONASE) 50 MCG/ACT nasal spray Place 2 sprays into both nostrils daily.   Yes [provider]  levothyroxine (LEVO-T) 75 MCG tablet Take 75 mcg by mouth daily before breakfast.   Yes [provider]  MELATONIN PO Take 1 capsule by mouth daily.   Yes [provider]  Omega-3 Fatty Acids (FISH OIL) 1000 MG CAPS Take 1,000 mg by mouth daily.   Yes [provider]  tobramycin (  TOBREX) 0.3 % ophthalmic solution Place 1 drop into the left eye 4 (four) times daily as needed (dry eye).   Yes [provider]    Family History History reviewed. No pertinent family history.  Social History Social History   Tobacco Use   Smoking status: Never Smoker   Smokeless tobacco: Never Used  Substance Use Topics   Alcohol use: Never    Frequency: Never   Drug use: Never     Allergies   Patient has no known allergies.   Review of Systems Review of Systems  Constitutional: Negative for chills and fever.  HENT: Negative for congestion.   Eyes: Negative for visual disturbance.  Respiratory: Positive for shortness of breath.   Cardiovascular:  Negative for chest pain.  Gastrointestinal: Negative for abdominal pain, nausea and vomiting.  Genitourinary: Negative for difficulty urinating.  Musculoskeletal: Negative for myalgias.  Skin: Negative for rash.  Neurological: Negative for headaches.     Physical Exam Updated Vital Signs BP (!) 160/60 (BP Location: Right Arm)    Pulse (!) 116    Temp 98 F (36.7 C) (Oral)    Resp 18    SpO2 97%   Physical Exam Vitals signs and nursing note reviewed.  Constitutional:      Appearance: She is not ill-appearing.     Comments: Frail elderly female  HENT:     Head: Normocephalic and atraumatic.  Eyes:     Conjunctiva/sclera: Conjunctivae normal.  Neck:     Musculoskeletal: Neck supple.  Cardiovascular:     Rate and Rhythm: Regular rhythm. Tachycardia present.     Pulses: Normal pulses.  Pulmonary:     Effort: Pulmonary effort is normal.     Breath sounds: Examination of the left-upper field reveals decreased breath sounds. Decreased breath sounds present. No wheezing, rhonchi or rales.  Chest:     Chest wall: No tenderness.  Abdominal:     Palpations: Abdomen is soft.     Tenderness: There is no abdominal tenderness. There is no guarding or rebound.  Musculoskeletal:     Right lower leg: No edema.     Left lower leg: No edema.  Skin:    General: Skin is warm and dry.  Neurological:     Mental Status: She is alert.      ED Treatments / Results  Labs (all labs ordered are listed, but only abnormal results are displayed) Labs Reviewed  COMPREHENSIVE METABOLIC PANEL - Abnormal; Notable for the following components:      Result Value   Glucose, Bld 121 (*)    BUN 24 (*)    Creatinine, Ser 1.19 (*)    Alkaline Phosphatase 30 (*)    GFR calc non Af Amer 41 (*)    GFR calc Af Amer 48 (*)    All other components within normal limits  D-DIMER, QUANTITATIVE (NOT AT [facility]) - Abnormal; Notable for the following components:   D-Dimer, Quant 3.35 (*)    All other components  within normal limits  TROPONIN I (HIGH SENSITIVITY) - Abnormal; Notable for the following components:   Troponin I (High Sensitivity) 22 (*)    All other components within normal limits  SARS CORONAVIRUS 2 (HOSPITAL ORDER, PERFORMED IN [facility])  CULTURE, BLOOD (ROUTINE X 2)  CULTURE, BLOOD (ROUTINE X 2)  URINE CULTURE  LACTIC ACID, PLASMA  CBC WITH DIFFERENTIAL/PLATELET  LACTIC ACID, PLASMA  URINALYSIS, ROUTINE W REFLEX MICROSCOPIC  TROPONIN I (HIGH SENSITIVITY)  EKG EKG Interpretation  Date/Time:  Thursday February 01 2019 11:55:19 EDT Ventricular Rate:  118 PR Interval:    QRS Duration: 104 QT Interval:  315 QTC Calculation: 432 R Axis:   18 Text Interpretation:  Sinus tachycardia Multiple premature complexes, vent & supraven Left ventricular hypertrophy Repol abnrm suggests ischemia, diffuse leads Poor data quality in current ECG precludes serial comparison Confirmed by Sherwood Gambler [id number]) on 02/01/2019 12:02:13 PM   Radiology Dg Chest Port 1 View  Result Date: 02/01/2019 CLINICAL DATA:  85 year old female with history of shortness of breath for 1 week. EXAM: PORTABLE CHEST 1 VIEW COMPARISON:  Chest x-ray 05/12/2009. FINDINGS: Lung volumes are normal. No acute consolidative airspace disease. Small bilateral pleural effusions. No evidence of pulmonary edema. Heart size is normal. Mass-like enlargement of the left hilar and suprahilar region, potentially vascular in origin. Aortic atherosclerosis. IMPRESSION: 1. Unusual mass-like enlargement of the left hilar and suprahilar region, potentially vascular in origin. However, the possibility of underlying neoplasm is not excluded. Further evaluation with contrast enhanced chest CT is recommended. 2. Small bilateral pleural effusions. Electronically Signed   By: Vinnie Langton M.D.   On: 02/01/2019 12:38    Procedures Procedures (including critical care time)  Medications Ordered in ED Medications  sodium  chloride 0.9 % bolus 1,000 mL (0 mLs Intravenous Stopped 02/01/19 1503)     Initial Impression / Assessment and Plan / ED Course  I have reviewed the triage vital signs and the nursing notes.  Pertinent labs & imaging results that were available during my care of the patient were reviewed by me and considered in my medical decision making (see chart for details).  Clinical Course as of Feb 01 1552  Thu Feb 01, 2019  1450 SARS Coronavirus 2: NEGATIVE [MV]  1515 Hx of CKD  Creatinine(!): 1.19 [MV]    Clinical Course User Index [MV] Eustaquio Maize, PA-C   85 year old female who presents today with complaints of shortness of breath.  Found to be hypoxic at PCPs office oxygen saturation 88%.  Patient's O2 sat dropped to 82% with arrival to the ED and was placed on 2 L.  Satting 97% currently.  Is tachycardic in the 110s.  She has no complaints of chest pain or any other complaints at this time.  SHe is afebrile.  Will work-up for sepsis at this time.  Will obtain d-dimer as well to rule out PE.  Will COVID swab.  Patient will need to be admitted with new oxygen requirement.   CXR with questionable mass; radiologist recommends CT chest with contrast. D dimer elevated; will obtain CTA chest to further evaluate mass vs PE. Trop mildly elevated at 22. No leukocytosis. Normal lactic acid. Creatinine stable from baseline with hx of CKD. Covid swab negative. Pending CTA prior to admission.   3:53 PM Still pending CTA. Discussed case with Dr. Laren Everts with hospitalist team who agrees to accept patient for admission.         Final Clinical Impressions(s) / ED Diagnoses   Final diagnoses:  Hypoxia  Hilar mass  Oxygen dependent    ED Discharge Orders    None       Eustaquio Maize, PA-C 02/01/19 1553    Sherwood Gambler, MD 02/02/19 [phone]

## 2019-02-01 NOTE — Progress Notes (Signed)
Pharmacy Antibiotic Note  Christine Cuevas is a 85 y.o. female admitted on 02/01/2019 with pneumonia. Previously on a Z-Pac (8/21) with mild improvement. Afebrile, WBC 7.5. Noted renal dysfunction. Pharmacy has been consulted for Cefepime dosing.   Plan: Cefepime 2 g IV once Cefepime 2 g IV q24h Monitor renal function Monitor C/s  Height: 5\' 4"  (162.6 cm) Weight: 88 lb (39.9 kg) IBW/kg (Calculated) : 54.7  Temp (24hrs), Avg:98 F (36.7 C), Min:98 F (36.7 C), Max:98 F (36.7 C)  Recent Labs  Lab 02/01/19 1215 02/01/19 1220  WBC  --  7.5  CREATININE  --  1.19*  LATICACIDVEN 1.8  --     Estimated Creatinine Clearance: 21 mL/min (A) (by C-G formula based on SCr of 1.19 mg/dL (H)).    No Known Allergies  Antimicrobials this admission: Cefepime 9/10 >>   Microbiology results: 9/10 BCx: pending   Thank you for allowing pharmacy to be a part of this patient's care.  Lorel Monaco, PharmD PGY1 Ambulatory Care Resident [employer] # [phone]

## 2019-02-01 NOTE — Progress Notes (Signed)
Pt admitted to [place] bed 20, daughter at bedside.  BP (!) 146/71 (BP Location: Left Arm)   Pulse 95   Temp 97.8 F (36.6 C) (Oral)   Resp (!) 28   Ht 5\' 1"  (1.549 m)   Wt 40 kg   SpO2 99%   BMI 16.66 kg/m   MEWS yellow alert fired for RR 28 - pt appears to have had elevated RR in ED. Pt not short of breath or dyspnic. MD Hijazi notified, no new orders needed. Will monitor.  Pt on progressive care monitor for continuous pulse ox (monitor M06).  Skin check performed with RN Jasmin - pt with blanchable reddened areas to thoracic spine, no open areas. Dry skin to feet, ruddy color to bilateral lower legs.  Discussed visitation policy with daughter, plan of care.

## 2019-02-01 NOTE — Consult Note (Addendum)
NAME:  TAL NEER, MRN:  [id number], DOB:  10-Jul-1929, LOS: 0 ADMISSION DATE:  02/01/2019, CONSULTATION DATE:  02/01/19 REFERRING MD:  Laren Everts  CHIEF COMPLAINT:  SOB   Brief History   JAYMARIE YEAKEL is a 85 y.o. female who was admitted 9/10 with dyspnea and had CXR concerning for left lung mass.  CT chest ordered and pending.  PCCM asked to consult for recs.  History of present illness   GULIANA WEYANDT is a 85 y.o. female who has PMH including HTN, emphysema, hypothyroidism.  She presented to [facility] ED 9/10 with dyspnea x 2 weeks.  Symptoms did not improve despite Z-pack which she received from PCP.  She returned to PCP and was noted to be hypoxic; therefore, was sent to ED.  She was apparently told that she had a "virus" in December 2019, unsure what kind.  She had never been tested for COVID; however, was tested this ED presentation and was negative.  She states initially, she had a cough productive of colored sputum.  Has continued to have cough; however, sputum color has cleared.  No recent fevers/chills/sweats, though did have fever in December.  No exposures to known sick contacts.  She had CXR which was concerning for left lung mass.  CT was ordered and is pending.  PCCM was consulted for further recs.  She endorses roughly 20lb weight loss in the past 1 year.  Denies any hemoptysis, melena, hematochezia.  No known hx of underlying malignancy.  She has never smoked.  She confirms that if CT were to indeed reveal underlying mass with concern for malignancy, she would NOT want to pursue biopsy and / or chemo.  Past Medical History  HTN, emphysema, hypothyroidism.  Significant Hospital Events   9/10 > admit.  Consults:  PCCM.  Procedures:  None.  Significant Diagnostic Tests:  CT chest 9/10 >   Micro Data:  SARS CoV2 9/10 >   Antimicrobials:  None.   Interim history/subjective:  Comfortable.  Has ongoing dyspnea and cough with exertion.  Objective:  Blood pressure 140/67,  pulse (!) 106, temperature 98 F (36.7 C), temperature source Oral, resp. rate (!) 22, height 5\' 4"  (1.626 m), weight 39.9 kg, SpO2 98 %.    FiO2 (%):  [2 %] 2 %  No intake or output data in the 24 hours ending 02/01/19 1654 Filed Weights   02/01/19 1228  Weight: 39.9 kg    Examination: General: Elderly female, in NAD. Neuro: A&O x 3, no deficits. HEENT: [place]/AT. Sclerae anicteric.  EOMI. Cardiovascular: RRR, no M/R/G.  Lungs: Respirations even and unlabored.  CTA bilaterally though diminished in bases. Abdomen: BS x 4, soft, NT/ND.  Musculoskeletal: No gross deformities, no edema. Skin: Intact, warm, no rashes.  Assessment & Plan:   Concern for left lung mass. - F/u on CT chest; however, even if shows concern for malignancy, pt would NOT want to pursue any biopsy and / or treatment.  Possible PNA - no obvious infiltrate on CXR; though does have ongoing productive cough (though sputum is now clear).  CT chest pending. - Assess PCT and f/u CT chest. - If above neg, can consider d/c abx.  Small effusions. - Assess echo. - Consider diuresis. - Defer any thora as pt not enthusiastic on invasive procedures.  Hx emphysema. - Continue BD's. - No role steroids given clear lung exam.   Best Practice:  Diet: Reg. Pain/Anxiety/Delirium protocol (if indicated): None. VAP protocol (if indicated): None. DVT prophylaxis:  Per primary. GI prophylaxis: None. Glucose control: Per primary. Mobility: Up with assist. Code Status: Full. Family Communication: Daughter updated at bedside. Disposition: Tele.  Labs   CBC: Recent Labs  Lab 02/01/19 1220  WBC 7.5  NEUTROABS 5.3  HGB 14.0  HCT 43.4  MCV 95.0  PLT 236   Basic Metabolic Panel: Recent Labs  Lab 02/01/19 1220  NA 140  K 4.5  CL 100  CO2 28  GLUCOSE 121*  BUN 24*  CREATININE 1.19*  CALCIUM 9.5   GFR: Estimated Creatinine Clearance: 21 mL/min (A) (by C-G formula based on SCr of 1.19 mg/dL (H)). Recent Labs   Lab 02/01/19 1215 02/01/19 1220  WBC  --  7.5  LATICACIDVEN 1.8  --    Liver Function Tests: Recent Labs  Lab 02/01/19 1220  AST 33  ALT 19  ALKPHOS 30*  BILITOT 0.8  PROT 6.6  ALBUMIN 3.5   No results for input(s): LIPASE, AMYLASE in the last 168 hours. No results for input(s): AMMONIA in the last 168 hours. ABG No results found for: PHART, PCO2ART, PO2ART, HCO3, TCO2, ACIDBASEDEF, O2SAT  Coagulation Profile: No results for input(s): INR, PROTIME in the last 168 hours. Cardiac Enzymes: No results for input(s): CKTOTAL, CKMB, CKMBINDEX, TROPONINI in the last 168 hours. HbA1C: No results found for: HGBA1C CBG: No results for input(s): GLUCAP in the last 168 hours.  Review of Systems:   All negative; except for those that are bolded, which indicate positives.  Constitutional: weight loss, weight gain, night sweats, fevers, chills, fatigue, weakness.  HEENT: headaches, sore throat, sneezing, nasal congestion, post nasal drip, difficulty swallowing, tooth/dental problems, visual complaints, visual changes, ear aches. Neuro: difficulty with speech, weakness, numbness, ataxia. CV:  chest pain, orthopnea, PND, swelling in lower extremities, dizziness, palpitations, syncope.  Resp: cough, hemoptysis, dyspnea, wheezing. GI: heartburn, indigestion, abdominal pain, nausea, vomiting, diarrhea, constipation, change in bowel habits, loss of appetite, hematemesis, melena, hematochezia.  GU: dysuria, change in color of urine, urgency or frequency, flank pain, hematuria. MSK: joint pain or swelling, decreased range of motion. Psych: change in mood or affect, depression, anxiety, suicidal ideations, homicidal ideations. Skin: rash, itching, bruising.  Past medical history  She,  has no past medical history on file.   Surgical History   History reviewed. No pertinent surgical history.   Social History   reports that she has never smoked. She has never used smokeless tobacco. She  reports that she does not drink alcohol or use drugs.   Family history   Her family history is not on file.   Allergies No Known Allergies   Home meds  Prior to Admission medications   Medication Sig Start Date End Date Taking? Authorizing Provider  albuterol (VENTOLIN HFA) 108 (90 Base) MCG/ACT inhaler Inhale 1-2 puffs into the lungs every 4 (four) hours as needed for wheezing or shortness of breath.   Yes [provider]  amLODipine (NORVASC) 5 MG tablet Take 5 mg by mouth daily.   Yes [provider]  Ascorbic Acid (VITAMIN C WITH ROSE HIPS) 500 MG tablet Take 500 mg by mouth daily.   Yes [provider]  aspirin (ASPIRIN 81) 81 MG EC tablet Take 81 mg by mouth daily.   Yes [provider]  calcium carbonate (CALCIUM 600) 1500 (600 Ca) MG TABS tablet Take 1,500 mg by mouth daily.   Yes [provider]  carboxymethylcellulose (REFRESH PLUS) 0.5 % SOLN Place 1 drop into both eyes 2 (two) times  daily as needed (dry eyes).   Yes [provider]  clorazepate (TRANXENE) 3.75 MG tablet Take 3.75 mg by mouth daily as needed for anxiety.   Yes [provider]  Digestive Enzyme CAPS Take 1 capsule by mouth daily.   Yes [provider]  fluticasone (FLONASE) 50 MCG/ACT nasal spray Place 2 sprays into both nostrils daily.   Yes [provider]  levothyroxine (LEVO-T) 75 MCG tablet Take 75 mcg by mouth daily before breakfast.   Yes [provider]  MELATONIN PO Take 1 capsule by mouth daily.   Yes [provider]  Omega-3 Fatty Acids (FISH OIL) 1000 MG CAPS Take 1,000 mg by mouth daily.   Yes [provider]  tobramycin (TOBREX) 0.3 % ophthalmic solution Place 1 drop into the left eye 4 (four) times daily as needed (dry eye).   Yes [provider]     Rutherford Guysahul Desai, PA - [facility] [place] [facility] Pager: [phone].  If no answer, [phone] 02/01/2019,  4:54 PM  Attending Note:  85 year old female with PMH above presenting with cough and fever.  No further complaints.  On exam, decreased BS diffusely but clear.  I reviewed chest CT myself, subpleural RUL mass with right sided pleural effusion.  Discussed with [facility]-MD and PCCM-NP.    Lung mass:             - Patient does not want treatment so no bronch             - If she changes her mind then a CT guided biopsy would be the approach  Pleural effusion:             - 2D echo             - Refusing thora  PNA:             - Cefepime             - Vanc             - F/U on cultures  Hypoxemia:             - Titrate O2 for sat of 88-92%  GOC:             - Full code but does not want procedures  PCCM will sign off, please call back if needed or patient changes her mind.  Patient seen and examined, agree with above note.  I dictated the care and orders written for this patient under my direction.  Alyson ReedyYacoub, Wesam G, MD [phone]

## 2019-02-01 NOTE — ED Notes (Addendum)
Pt ambulated with pulse ox on. Pt had steady gait no assistance needed. Pt oz dropped from 94% on RA to 82% on RA. Tech placed pt on 2 liters nasal canula. Pt oz is onw 97% with 2 liters of xoygen via nasal canula.Tech notified nurse.

## 2019-02-01 NOTE — ED Triage Notes (Signed)
Pt presents with SOB x2 weeks , states its been going on since December when she was diagnosed with a :virus" unsure of what the virus was. sats 94% in bed, dropped to 88% on R when NT ambulated pt in room. 96% 2L [place]

## 2019-02-01 NOTE — H&P (Addendum)
[facility]                                                                                    Patient Demographics  Christine Cuevas, is a 85 y.o. female  CSN: [id number]  MRN: [id number]  DOB - [birth year]  Admit Date - 02/01/2019  Outpatient Primary MD for the patient is Velna Hatchet, MD   With History of -  History reviewed. No pertinent past medical history.    History reviewed. No pertinent surgical history.  in for   Chief Complaint  Patient presents with  . Shortness of Breath     HPI  Christine Cuevas  is a 85 y.o. female, with past medical history significant for hypertension, hypothyroidism and COPD presenting with 2 weeks history of shortness of breath that did not improve on outpatient therapy with Z-Pak.  Patient went to her PCPs office today for further evaluation and he noted that she had hypoxemia.  Patient had multiple similar in the last few months and has noted weight loss of around 20 pounds in the last 1 year.  Patient lives alone, denies any chest pain, nausea vomiting or diarrhea. Work-up in the emergency room showed white blood cell count 7.5 hemoglobin 14 platelets 236, sodium 140, potassium 4.5, creatinine 1.19. Chest x-ray showed left hilar and suprahilar masslike enlargement with possibility of neoplasm and small bilateral pleural effusions.  Pulmonology was consulted.  CT of the chest is pending    Review of Systems    In addition to the HPI above,  No Headache, No changes with Vision or hearing, No problems swallowing food or Liquids, No Chest pain, No Abdominal pain, No Nausea or Vommitting, Bowel movements are regular, No Blood in stool or Urine, No dysuria, No new skin rashes or bruises, No new joints pains-aches,  No new weakness, tingling, numbness in any extremity, No recent weight gain or loss, No polyuria, polydypsia or polyphagia, No significant Mental Stressors.  All other systems were reviewed and were  negative   Social History Social History   Tobacco Use  . Smoking status: Never Smoker  . Smokeless tobacco: Never Used  Substance Use Topics  . Alcohol use: Never    Frequency: Never     Family History History reviewed. No pertinent family history.   Prior to Admission medications   Medication Sig Start Date End Date Taking? Authorizing Provider  albuterol (VENTOLIN HFA) 108 (90 Base) MCG/ACT inhaler Inhale 1-2 puffs into the lungs every 4 (four) hours as needed for wheezing or shortness of breath.   Yes [provider]  amLODipine (NORVASC) 5 MG tablet Take 5 mg by mouth daily.   Yes [provider]  Ascorbic Acid (VITAMIN C WITH ROSE HIPS) 500 MG tablet Take 500 mg by mouth daily.   Yes [provider]  aspirin (ASPIRIN 81) 81 MG EC tablet Take 81 mg by mouth daily.   Yes [provider]  calcium carbonate (CALCIUM 600) 1500 (600 Ca) MG TABS tablet Take 1,500 mg by mouth daily.   Yes [provider]  carboxymethylcellulose (REFRESH PLUS) 0.5 % SOLN Place 1 drop into both eyes 2 (two)  times daily as needed (dry eyes).   Yes [provider]  clorazepate (TRANXENE) 3.75 MG tablet Take 3.75 mg by mouth daily as needed for anxiety.   Yes [provider]  Digestive Enzyme CAPS Take 1 capsule by mouth daily.   Yes [provider]  fluticasone (FLONASE) 50 MCG/ACT nasal spray Place 2 sprays into both nostrils daily.   Yes [provider]  levothyroxine (LEVO-T) 75 MCG tablet Take 75 mcg by mouth daily before breakfast.   Yes [provider]  MELATONIN PO Take 1 capsule by mouth daily.   Yes [provider]  Omega-3 Fatty Acids (FISH OIL) 1000 MG CAPS Take 1,000 mg by mouth daily.   Yes [provider]  tobramycin (TOBREX) 0.3 % ophthalmic solution Place 1 drop into the left eye 4 (four) times daily as needed (dry eye).   Yes [provider]    No Known  Allergies  Physical Exam  Vitals  Blood pressure 140/67, pulse (!) 106, temperature 98 F (36.7 C), temperature source Oral, resp. rate (!) 22, height 5\' 4"  (1.626 m), weight 39.9 kg, SpO2 98 %.   General appearance chronically ill, cachectic looks tired HEENT no jaundice or pallor, no facial deviation oral thrush Neck supple, no neck vein distention Chest decreased breath sounds bilaterally Heart normal S1-S2, no murmurs gallops or rubs Abdomen soft, nontender, bowel sounds present Extremities no clubbing cyanosis or edema Skin no rashes or ulcers   Data Review  CBC Recent Labs  Lab 02/01/19 1220  WBC 7.5  HGB 14.0  HCT 43.4  PLT 236  MCV 95.0  MCH 30.6  MCHC 32.3  RDW 14.5  LYMPHSABS 1.4  MONOABS 0.5  EOSABS 0.3  BASOSABS 0.1   ------------------------------------------------------------------------------------------------------------------  Chemistries  Recent Labs  Lab 02/01/19 1220  NA 140  K 4.5  CL 100  CO2 28  GLUCOSE 121*  BUN 24*  CREATININE 1.19*  CALCIUM 9.5  AST 33  ALT 19  ALKPHOS 30*  BILITOT 0.8   ------------------------------------------------------------------------------------------------------------------ estimated creatinine clearance is 21 mL/min (A) (by C-G formula based on SCr of 1.19 mg/dL (H)). ------------------------------------------------------------------------------------------------------------------ No results for input(s): TSH, T4TOTAL, T3FREE, THYROIDAB in the last 72 hours.  Invalid input(s): FREET3   Coagulation profile No results for input(s): INR, PROTIME in the last 168 hours. ------------------------------------------------------------------------------------------------------------------- Recent Labs    02/01/19 1220  DDIMER 3.35*   -------------------------------------------------------------------------------------------------------------------  Cardiac Enzymes No results for input(s): CKMB,  TROPONINI, MYOGLOBIN in the last 168 hours.  Invalid input(s): CK ------------------------------------------------------------------------------------------------------------------ Invalid input(s): POCBNP   ---------------------------------------------------------------------------------------------------------------  Urinalysis No results found for: COLORURINE, APPEARANCEUR, LABSPEC, PHURINE, GLUCOSEU, HGBUR, BILIRUBINUR, KETONESUR, PROTEINUR, UROBILINOGEN, NITRITE, LEUKOCYTESUR  ----------------------------------------------------------------------------------------------------------------   Imaging results:   Dg Chest Port 1 View  Result Date: 02/01/2019 CLINICAL DATA:  85 year old female with history of shortness of breath for 1 week. EXAM: PORTABLE CHEST 1 VIEW COMPARISON:  Chest x-ray 05/12/2009. FINDINGS: Lung volumes are normal. No acute consolidative airspace disease. Small bilateral pleural effusions. No evidence of pulmonary edema. Heart size is normal. Mass-like enlargement of the left hilar and suprahilar region, potentially vascular in origin. Aortic atherosclerosis. IMPRESSION: 1. Unusual mass-like enlargement of the left hilar and suprahilar region, potentially vascular in origin. However, the possibility of underlying neoplasm is not excluded. Further evaluation with contrast enhanced chest CT is recommended. 2. Small bilateral pleural effusions. Electronically Signed   By: Trudie Reed M.D.   On: 02/01/2019 12:38    My personal review of EKG: Sinus  tach at 118 bpm with LVH  Assessment & Plan  Lung mass/pneumonia Pulmonology on consult CT of chest is pending IV antibiotics, cefepime Check sputum culture and antigens  COPD Continue with nebulizer treatments  History of hypertension Continue with Norvasc  History of hypothyroidism Continue Synthroid  DVT Prophylaxis Heparin   AM Labs Ordered, also please review Full Orders  Family Communication:  Discussed with daughter at bedside Code Status full  Disposition Plan: Home  Time spent in minutes : 38 minutes  Condition GUARDED   @SIGNATURE @

## 2019-02-02 DIAGNOSIS — J449 Chronic obstructive pulmonary disease, unspecified: Secondary | ICD-10-CM | POA: Diagnosis not present

## 2019-02-02 DIAGNOSIS — I719 Aortic aneurysm of unspecified site, without rupture: Secondary | ICD-10-CM

## 2019-02-02 DIAGNOSIS — J189 Pneumonia, unspecified organism: Secondary | ICD-10-CM | POA: Diagnosis not present

## 2019-02-02 DIAGNOSIS — I10 Essential (primary) hypertension: Secondary | ICD-10-CM | POA: Diagnosis not present

## 2019-02-02 DIAGNOSIS — R918 Other nonspecific abnormal finding of lung field: Secondary | ICD-10-CM | POA: Diagnosis not present

## 2019-02-02 LAB — BASIC METABOLIC PANEL
Anion gap: 9 (ref 5–15)
BUN: 22 mg/dL (ref 8–23)
CO2: 31 mmol/L (ref 22–32)
Calcium: 8.8 mg/dL — ABNORMAL LOW (ref 8.9–10.3)
Chloride: 100 mmol/L (ref 98–111)
Creatinine, Ser: 1.13 mg/dL — ABNORMAL HIGH (ref 0.44–1.00)
GFR calc Af Amer: 51 mL/min — ABNORMAL LOW (ref >60)
GFR calc non Af Amer: 44 mL/min — ABNORMAL LOW (ref >60)
Glucose, Bld: 91 mg/dL (ref 70–99)
Potassium: 4.3 mmol/L (ref 3.5–5.1)
Sodium: 140 mmol/L (ref 135–145)

## 2019-02-02 MED ORDER — ALBUTEROL SULFATE (2.5 MG/3ML) 0.083% IN NEBU
2.5000 mg | INHALATION_SOLUTION | Freq: Two times a day (BID) | RESPIRATORY_TRACT | Status: DC
  Administered 2019-02-02: 2.5 mg via RESPIRATORY_TRACT
  Filled 2019-02-02: qty 3

## 2019-02-02 MED ORDER — AZITHROMYCIN 500 MG PO TABS: 500.0000 mg | ORAL_TABLET | Freq: Every day | ORAL | 0 refills | Status: DC

## 2019-02-02 MED ORDER — OXYCODONE HCL 5 MG PO TABS: 5.0000 mg | ORAL_TABLET | ORAL | 0 refills | Status: AC | PRN

## 2019-02-02 MED ORDER — IPRATROPIUM BROMIDE 0.02 % IN SOLN
0.5000 mg | Freq: Two times a day (BID) | RESPIRATORY_TRACT | Status: DC
  Administered 2019-02-02: 0.5 mg via RESPIRATORY_TRACT
  Filled 2019-02-02: qty 2.5

## 2019-02-02 MED ORDER — IPRATROPIUM-ALBUTEROL 0.5-2.5 (3) MG/3ML IN SOLN: 3.0000 mL | Freq: Two times a day (BID) | RESPIRATORY_TRACT | Status: DC

## 2019-02-02 MED ORDER — GUAIFENESIN ER 600 MG PO TB12: 600.0000 mg | ORAL_TABLET | Freq: Two times a day (BID) | ORAL | 0 refills | Status: AC

## 2019-02-02 MED ORDER — AMOXICILLIN-POT CLAVULANATE 875-125 MG PO TABS: 1.0000 | ORAL_TABLET | Freq: Two times a day (BID) | ORAL | 0 refills | Status: AC

## 2019-02-02 NOTE — Evaluation (Signed)
Physical Therapy Evaluation Patient Details Name: Christine Cuevas MRN: [id number] DOB: Feb 16, 1930 Today's Date: 02/02/2019   History of Present Illness  Pt is a 85 y.o. F with significant PMH of HTN, emphysema, hypothyroidism. Presents to [facility] ED 9/10 with dyspnea x 2 weeks. CT angiogram of chest showing probable left upper lobe mass along with severe dilatation of ascending aorta, aortic arch, descending aorta with question of mural thrombus vs acute intramural hemorrhage. Pt/family opting for no further intervention, plan for discharge home with hospice.  Clinical Impression  Pt admitted with above. Pt presents with decreased functional mobility secondary to generalized weakness and decreased endurance. Ambulating 100 feet via handheld assist, Spo2 90% on RA. Education provided to pt/pt daughter on energy conservation techniques and activity recommendations. Recommending HHPT at discharge.     Follow Up Recommendations Home health PT;Supervision for mobility/OOB    Equipment Recommendations  None recommended by PT    Recommendations for Other Services       Precautions / Restrictions Precautions Precautions: Fall Restrictions Weight Bearing Restrictions: No      Mobility  Bed Mobility Overal bed mobility: Independent                Transfers Overall transfer level: Needs assistance Equipment used: None Transfers: Sit to/from Stand Sit to Stand: Supervision            Ambulation/Gait Ambulation/Gait assistance: Supervision Gait Distance (Feet): 100 Feet Assistive device: 1 person hand held assist Gait Pattern/deviations: Step-through pattern;Decreased stride length Gait velocity: decreased   General Gait Details: No overt LOB, fatigues easily  Stairs            Wheelchair Mobility    Modified Rankin (Stroke Patients Only)       Balance Overall balance assessment: Mild deficits observed, not formally tested                                            Pertinent Vitals/Pain Pain Assessment: No/denies pain    Home Living Family/patient expects to be discharged to:: Private residence Living Arrangements: Alone Available Help at Discharge: Family Type of Home: House Home Access: Stairs to enter   Technical brewer of Steps: 1 Home Layout: One level Home Equipment: Environmental consultant - 2 wheels;Cane - single point;Shower seat      Prior Function Level of Independence: Independent               Hand Dominance        Extremity/Trunk Assessment   Upper Extremity Assessment Upper Extremity Assessment: Overall WFL for tasks assessed    Lower Extremity Assessment Lower Extremity Assessment: Generalized weakness    Cervical / Trunk Assessment Cervical / Trunk Assessment: Kyphotic  Communication   Communication: HOH  Cognition Arousal/Alertness: Awake/alert Behavior During Therapy: WFL for tasks assessed/performed Overall Cognitive Status: Within Functional Limits for tasks assessed                                        General Comments      Exercises     Assessment/Plan    PT Assessment Patient needs continued PT services  PT Problem List Decreased strength;Decreased activity tolerance;Decreased balance;Decreased mobility       PT Treatment Interventions DME instruction;Gait training;Stair training;Functional mobility training;Therapeutic activities;Balance training;Therapeutic exercise;Patient/family education  PT Goals (Current goals can be found in the Care Plan section)  Acute Rehab PT Goals Patient Stated Goal: "get stronger." PT Goal Formulation: With patient Time For Goal Achievement: 02/16/19 Potential to Achieve Goals: Fair    Frequency Min 3X/week   Barriers to discharge        Co-evaluation               AM-PAC PT "6 Clicks" Mobility  Outcome Measure Help needed turning from your back to your side while in a flat bed without using bedrails?:  None Help needed moving from lying on your back to sitting on the side of a flat bed without using bedrails?: None Help needed moving to and from a bed to a chair (including a wheelchair)?: None Help needed standing up from a chair using your arms (e.g., wheelchair or bedside chair)?: None Help needed to walk in hospital room?: None Help needed climbing 3-5 steps with a railing? : A Little 6 Click Score: 23    End of Session   Activity Tolerance: Patient tolerated treatment well Patient left: in bed;with call bell/phone within reach;with bed alarm set;with family/visitor present   PT Visit Diagnosis: Unsteadiness on feet (R26.81);Difficulty in walking, not elsewhere classified (R26.2)    Time: 1038-1050 PT Time Calculation (min) (ACUTE ONLY): 12 min   Charges:   PT Evaluation $PT Eval Moderate Complexity: 1 Mod          Laurina Bustlearoline Brezlyn Manrique, [place] Carolina[place], DPT Acute Rehabilitation Services Pager [phone] Office [phone]   Vanetta MuldersCarloine H Charli Liberatore 02/02/2019, 1:04 PM

## 2019-02-02 NOTE — Progress Notes (Signed)
Yellow MEWS protocol initiated for RR of 30. This is not an acute change from patient's current diagnosis for which she is currently receiving treatment. Will continue to monitor.

## 2019-02-02 NOTE — Discharge Summary (Addendum)
Physician Discharge Summary  Christine Cuevas ZOX:[id number] DOB: 02-07-1930 DOA: 02/01/2019  PCP: Alysia PennaHolwerda, Scott, MD  Admit date: 02/01/2019 Discharge date: 02/02/2019  Admitted From: Home Disposition: Home with hospice  Recommendations for Outpatient Follow-up:  1. Follow up with PCP in 1 week  2. Follow-up with home hospice at earliest convenience    Home Health: Home hospice Equipment/Devices: None  Discharge Condition: Poor CODE STATUS: Full Diet recommendation: Regular  Brief/Interim Summary: 85 year old female with history of hypertension, hypothyroidism and COPD presented with worsening shortness of breath.  She was found to be hypoxic and chest x-ray showed probable lung mass.  Pulmonary was consulted.  CT angiogram of the chest showed probable left upper lobe mass along with severe dilatation of ascending aorta, aortic arch, descending aorta with question of mural thrombus versus acute intramural hemorrhage.  Patient/family opted for no further intervention and was interested in hospice.  She will be discharged home with hospice.  Discharge Diagnoses:   Possible left upper lobe mass Severe dilation of ascending aorta, aortic arch, descending aorta with question of mural thrombus versus acute intra-mural hemorrhage COPD Hypertension Hypothyroidism  Plan -Patient presented with shortness of breath and required supplemental oxygen initially.  She was started on broad-spectrum antibiotics.   - CT angiogram of the chest showed probable left upper lobe mass along with severe dilatation of ascending aorta, aortic arch, descending aorta with question of mural thrombus versus acute intramural hemorrhage.  Patient/family opted for no further intervention and was interested in hospice.  She will be discharged home with hospice. -Currently her oxygen saturations are above 90% on ambulation on room air.  She will not require any oxygen at this time but might require oxygen as an  outpatient. -Pulmonary evaluated the patient and since patient did not want any further work-up, they have signed off.  I had spoken to vascular surgery regarding the CT findings and recommended thoracic surgery consultation.  I have called thoracic surgery for consultation which is pending.  Since the patient and family do not want any further intervention and patient being very high risk for any surgery, she will be discharged home with hospice.  There is no evidence of any infiltrates.  Will discharge on oral Augmentin for 5 days for possibility of bronchitis. -If condition worsens, consider full comfort measures.   Discharge Instructions  Discharge Instructions    Diet general   Complete by: As directed    Increase activity slowly   Complete by: As directed      Allergies as of 02/02/2019      Reactions   Codeine       Medication List    TAKE these medications   albuterol 108 (90 Base) MCG/ACT inhaler Commonly known as: VENTOLIN HFA Inhale 1-2 puffs into the lungs every 4 (four) hours as needed for wheezing or shortness of breath.   amLODipine 5 MG tablet Commonly known as: NORVASC Take 5 mg by mouth daily.   amoxicillin-clavulanate 875-125 MG tablet Commonly known as: Augmentin Take 1 tablet by mouth 2 (two) times daily for 5 days.   Aspirin 81 81 MG EC tablet Generic drug: aspirin Take 81 mg by mouth daily.   Calcium 600 1500 (600 Ca) MG Tabs tablet Generic drug: calcium carbonate Take 1,500 mg by mouth daily.   carboxymethylcellulose 0.5 % Soln Commonly known as: REFRESH PLUS Place 1 drop into both eyes 2 (two) times daily as needed (dry eyes).   clorazepate 3.75 MG tablet Commonly known as: TRANXENE  Take 3.75 mg by mouth daily as needed for anxiety.   Digestive Enzyme Caps Take 1 capsule by mouth daily.   Fish Oil 1000 MG Caps Take 1,000 mg by mouth daily.   fluticasone 50 MCG/ACT nasal spray Commonly known as: FLONASE Place 2 sprays into both nostrils  daily.   guaiFENesin 600 MG 12 hr tablet Commonly known as: MUCINEX Take 1 tablet (600 mg total) by mouth 2 (two) times daily.   Levo-T 75 MCG tablet Generic drug: levothyroxine Take 75 mcg by mouth daily before breakfast.   MELATONIN PO Take 1 capsule by mouth daily.   oxyCODONE 5 MG immediate release tablet Commonly known as: Oxy IR/ROXICODONE Take 1 tablet (5 mg total) by mouth every 4 (four) hours as needed for moderate pain.   tobramycin 0.3 % ophthalmic solution Commonly known as: TOBREX Place 1 drop into the left eye 4 (four) times daily as needed (dry eye).   vitamin C with rose hips 500 MG tablet Take 500 mg by mouth daily.       Follow-up Information    Home hospice Follow up.   Why: At earliest convenience       Alysia Penna, MD. Schedule an appointment as soon as possible for a visit in 1 week(s).   Specialty: Internal Medicine Contact information: [address] [place] [address] [phone]          Allergies  Allergen Reactions  . Codeine     Consultations:  Pulmonary.  Thoracic surgery/palliative care consultation pending   Procedures/Studies: Ct Angio Chest Pe W/cm &/or Wo Cm  Result Date: 02/01/2019 CLINICAL DATA:  85 year old female with history of shortness of breath. Possible mass lesion noted on recent chest x-ray. EXAM: CT ANGIOGRAPHY CHEST WITH CONTRAST TECHNIQUE: Multidetector CT imaging of the chest was performed using the standard protocol during bolus administration of intravenous contrast. Multiplanar CT image reconstructions and MIPs were obtained to evaluate the vascular anatomy. CONTRAST:  75mL OMNIPAQUE IOHEXOL 300 MG/ML  SOLN COMPARISON:  No priors. FINDINGS: Cardiovascular: Heart size is normal. There is no significant pericardial fluid, thickening or pericardial calcification. There is aortic atherosclerosis, as well as atherosclerosis of the great vessels of the mediastinum and the coronary arteries, including  calcified atherosclerotic plaque in the left main, left anterior descending and right coronary arteries. Severe aneurysmal dilatation of the thoracic aorta. Specifically, the ascending thoracic aorta measures up to 7.1 cm in diameter, the mid aortic arch measures up to 5.4 cm in diameter, and the proximal descending thoracic aorta measures up to 5.8 cm in diameter. In the proximal to mid descending thoracic aorta there is some eccentric nonenhancing material which is favored to represent mural thrombus, although the possibility of intramural hemorrhage is not entirely excluded (noncontrast images were not obtained). Additionally, a short segment dissection with occluded false lumen is not favored, but not entirely excluded. Mediastinum/Nodes: No pathologically enlarged mediastinal or hilar lymph nodes. Esophagus is unremarkable in appearance. No axillary lymphadenopathy. Lungs/Pleura: Moderate right and small left pleural effusions lying dependently with some associated passive subsegmental atelectasis in the dependent portions of the lungs bilaterally. In the apices of the lungs there are extensive areas of pleuroparenchymal thickening and architectural distortion, favored to reflect chronic post infectious or inflammatory scarring. This is particularly mass-like in the periphery of the left upper lobe where this measures up to 3.4 x 2.4 cm (axial image 20 of series 5). Some faint calcifications are evident within these areas of probable scarring. No other definite  suspicious appearing pulmonary nodules or masses are noted. Diffuse bronchial wall thickening with moderate centrilobular and paraseptal emphysema. No acute consolidative airspace disease. Upper Abdomen: 1 cm low-attenuation lesion in segment 4A, compatible with a small cyst. Aortic atherosclerosis. Musculoskeletal: There are no aggressive appearing lytic or blastic lesions noted in the visualized portions of the skeleton. Review of the MIP images  confirms the above findings. IMPRESSION: 1. Severe aneurysmal dilatation of the thoracic aorta involving the ascending aorta, aortic arch and descending thoracic aorta, including eccentric nonenhancing material in the descending thoracic aorta which is favored to reflect the presence of mural thrombus. Strictly speaking, the possibility of acute intramural hemorrhage or short segment dissection with thrombosed false lumen is not entirely excluded, but is not strongly favored, particularly in the absence of a history of chest pain. Consultation with Thoracic surgery is recommended to guide future management. 2. There is also left main and 2 vessel coronary artery disease. 3. Diffuse bronchial wall thickening with moderate centrilobular and paraseptal emphysema; imaging findings suggestive of underlying COPD. 4. Probable areas of chronic post infectious or inflammatory scarring in the apices of the lungs bilaterally (right greater than left). Attention on follow-up studies is recommended to ensure the stability of these findings. Aortic Atherosclerosis (ICD10-I70.0). Emphysema (ICD10-J43.9). Aortic aneurysm NOS (ICD10-I71.9). Electronically Signed   By: Trudie Reedaniel  Entrikin M.D.   On: 02/01/2019 18:10   Dg Chest Port 1 View  Result Date: 02/01/2019 CLINICAL DATA:  85 year old female with history of shortness of breath for 1 week. EXAM: PORTABLE CHEST 1 VIEW COMPARISON:  Chest x-ray 05/12/2009. FINDINGS: Lung volumes are normal. No acute consolidative airspace disease. Small bilateral pleural effusions. No evidence of pulmonary edema. Heart size is normal. Mass-like enlargement of the left hilar and suprahilar region, potentially vascular in origin. Aortic atherosclerosis. IMPRESSION: 1. Unusual mass-like enlargement of the left hilar and suprahilar region, potentially vascular in origin. However, the possibility of underlying neoplasm is not excluded. Further evaluation with contrast enhanced chest CT is recommended.  2. Small bilateral pleural effusions. Electronically Signed   By: Trudie Reedaniel  Entrikin M.D.   On: 02/01/2019 12:38       Subjective: Patient seen and examined at bedside.  Discussed with daughter present at bedside.  Denies worsening fever, shortness of breath, chest pain.  Thinks that she should be okay going home with hospice.  Discharge Exam: Vitals:   02/02/19 0807 02/02/19 1000  BP:    Pulse:    Resp:    Temp:    SpO2: 97% 96%    General: Pt is alert, awake, not in acute distress.  Very thinly built elderly female lying in bed. Cardiovascular: rate controlled, S1/S2 + Respiratory: bilateral decreased breath sounds at bases with some scattered crackles Abdominal: Soft, NT, ND, bowel sounds + Extremities: no edema, no cyanosis    The results of significant diagnostics from this hospitalization (including imaging, microbiology, ancillary and laboratory) are listed below for reference.     Microbiology: Recent Results (from the past 240 hour(s))  SARS Coronavirus 2 [facility](Hospital order, Performed in [facility] lab) Nasopharyngeal Nasopharyngeal Swab     Status: None   Collection Time: 02/01/19  1:40 PM   Specimen: Nasopharyngeal Swab  Result Value Ref Range Status   SARS Coronavirus 2 NEGATIVE NEGATIVE Final    Comment: (NOTE) If result is NEGATIVE SARS-CoV-2 target nucleic acids are NOT DETECTED. The SARS-CoV-2 RNA is generally detectable in upper and lower  respiratory specimens during the acute phase of infection. The  lowest  concentration of SARS-CoV-2 viral copies this assay can detect is 250  copies / mL. A negative result does not preclude SARS-CoV-2 infection  and should not be used as the sole basis for treatment or other  patient management decisions.  A negative result may occur with  improper specimen collection / handling, submission of specimen other  than nasopharyngeal swab, presence of viral mutation(s) within the  areas targeted by this assay, and  inadequate number of viral copies  (<250 copies / mL). A negative result must be combined with clinical  observations, patient history, and epidemiological information. If result is POSITIVE SARS-CoV-2 target nucleic acids are DETECTED. The SARS-CoV-2 RNA is generally detectable in upper and lower  respiratory specimens dur ing the acute phase of infection.  Positive  results are indicative of active infection with SARS-CoV-2.  Clinical  correlation with patient history and other diagnostic information is  necessary to determine patient infection status.  Positive results do  not rule out bacterial infection or co-infection with other viruses. If result is PRESUMPTIVE POSTIVE SARS-CoV-2 nucleic acids MAY BE PRESENT.   A presumptive positive result was obtained on the submitted specimen  and confirmed on repeat testing.  While 2019 novel coronavirus  (SARS-CoV-2) nucleic acids may be present in the submitted sample  additional confirmatory testing may be necessary for epidemiological  and / or clinical management purposes  to differentiate between  SARS-CoV-2 and other Sarbecovirus currently known to infect humans.  If clinically indicated additional testing with an alternate test  methodology [phone]) is advised. The SARS-CoV-2 RNA is generally  detectable in upper and lower respiratory sp ecimens during the acute  phase of infection. The expected result is Negative. Fact Sheet for Patients:  StrictlyIdeas.no Fact Sheet for Healthcare Providers: BankingDealers.co.za This test is not yet approved or cleared by the [place] FDA and has been authorized for detection and/or diagnosis of SARS-CoV-2 by FDA under an Emergency Use Authorization (EUA).  This EUA will remain in effect (meaning this test can be used) for the duration of the COVID-19 declaration under Section 564(b)(1) of the Act, 21 U.S.C. section 360bbb-3(b)(1), unless the  authorization is terminated or revoked sooner. Performed at [facility], [place] [address], [place], [place] [address]      Labs: BNP (last 3 results) No results for input(s): BNP in the last 8760 hours. Basic Metabolic Panel: Recent Labs  Lab 02/01/19 1220 02/02/19 0239  NA 140 140  K 4.5 4.3  CL 100 100  CO2 28 31  GLUCOSE 121* 91  BUN 24* 22  CREATININE 1.19* 1.13*  CALCIUM 9.5 8.8*   Liver Function Tests: Recent Labs  Lab 02/01/19 1220  AST 33  ALT 19  ALKPHOS 30*  BILITOT 0.8  PROT 6.6  ALBUMIN 3.5   No results for input(s): LIPASE, AMYLASE in the last 168 hours. No results for input(s): AMMONIA in the last 168 hours. CBC: Recent Labs  Lab 02/01/19 1220  WBC 7.5  NEUTROABS 5.3  HGB 14.0  HCT 43.4  MCV 95.0  PLT 236   Cardiac Enzymes: No results for input(s): CKTOTAL, CKMB, CKMBINDEX, TROPONINI in the last 168 hours. BNP: Invalid input(s): POCBNP CBG: No results for input(s): GLUCAP in the last 168 hours. D-Dimer Recent Labs    02/01/19 1220  DDIMER 3.35*   Hgb A1c No results for input(s): HGBA1C in the last 72 hours. Lipid Profile No results for input(s): CHOL, HDL, LDLCALC, TRIG, CHOLHDL, LDLDIRECT in the  last 72 hours. Thyroid function studies No results for input(s): TSH, T4TOTAL, T3FREE, THYROIDAB in the last 72 hours.  Invalid input(s): FREET3 Anemia work up No results for input(s): VITAMINB12, FOLATE, FERRITIN, TIBC, IRON, RETICCTPCT in the last 72 hours. Urinalysis No results found for: COLORURINE, APPEARANCEUR, LABSPEC, PHURINE, GLUCOSEU, HGBUR, BILIRUBINUR, KETONESUR, PROTEINUR, UROBILINOGEN, NITRITE, LEUKOCYTESUR Sepsis Labs Invalid input(s): PROCALCITONIN,  WBC,  LACTICIDVEN Microbiology Recent Results (from the past 240 hour(s))  SARS Coronavirus 2 [facility] order, Performed in [facility] hospital lab) Nasopharyngeal Nasopharyngeal Swab     Status: None   Collection Time: 02/01/19  1:40 PM   Specimen: Nasopharyngeal  Swab  Result Value Ref Range Status   SARS Coronavirus 2 NEGATIVE NEGATIVE Final    Comment: (NOTE) If result is NEGATIVE SARS-CoV-2 target nucleic acids are NOT DETECTED. The SARS-CoV-2 RNA is generally detectable in upper and lower  respiratory specimens during the acute phase of infection. The lowest  concentration of SARS-CoV-2 viral copies this assay can detect is 250  copies / mL. A negative result does not preclude SARS-CoV-2 infection  and should not be used as the sole basis for treatment or other  patient management decisions.  A negative result may occur with  improper specimen collection / handling, submission of specimen other  than nasopharyngeal swab, presence of viral mutation(s) within the  areas targeted by this assay, and inadequate number of viral copies  (<250 copies / mL). A negative result must be combined with clinical  observations, patient history, and epidemiological information. If result is POSITIVE SARS-CoV-2 target nucleic acids are DETECTED. The SARS-CoV-2 RNA is generally detectable in upper and lower  respiratory specimens dur ing the acute phase of infection.  Positive  results are indicative of active infection with SARS-CoV-2.  Clinical  correlation with patient history and other diagnostic information is  necessary to determine patient infection status.  Positive results do  not rule out bacterial infection or co-infection with other viruses. If result is PRESUMPTIVE POSTIVE SARS-CoV-2 nucleic acids MAY BE PRESENT.   A presumptive positive result was obtained on the submitted specimen  and confirmed on repeat testing.  While 2019 novel coronavirus  (SARS-CoV-2) nucleic acids may be present in the submitted sample  additional confirmatory testing may be necessary for epidemiological  and / or clinical management purposes  to differentiate between  SARS-CoV-2 and other Sarbecovirus currently known to infect humans.  If clinically indicated  additional testing with an alternate test  methodology [phone]) is advised. The SARS-CoV-2 RNA is generally  detectable in upper and lower respiratory sp ecimens during the acute  phase of infection. The expected result is Negative. Fact Sheet for Patients:  BoilerBrush.com.cy Fact Sheet for Healthcare Providers: https://pope.com/ This test is not yet approved or cleared by the [place] FDA and has been authorized for detection and/or diagnosis of SARS-CoV-2 by FDA under an Emergency Use Authorization (EUA).  This EUA will remain in effect (meaning this test can be used) for the duration of the COVID-19 declaration under Section 564(b)(1) of the Act, 21 U.S.C. section 360bbb-3(b)(1), unless the authorization is terminated or revoked sooner. Performed at [facility], [address] [place], [place] [address]      Time coordinating discharge: 35 minutes  SIGNED:   Glade Lloyd, MD  [facility] 02/02/2019, 11:44 AM

## 2019-02-02 NOTE — Progress Notes (Addendum)
AVS printed and discussed with patient and her daughter. We went over medications and importance of completing course of antibiotics. I answered questions about oxycodone, especially about driving (that she should not drive while taking). Pt verbalized about her follow up appointments and already has a visit scheduled for tomorrow with home hospice. Volunteer here to take patient down in wheelchair. IV and telemetry removed. PT saw patient - no need for home oxygen.  Of note, pt has had yellow MEWS scores of 2 for elevated respiratory rate. While this rate is accurate (20s), MDs are aware and patient is otherwise in no distress. This is not an acute change and pt is appropriate for discharge.

## 2019-02-02 NOTE — Progress Notes (Addendum)
12pm-Patient able to discharge home today per [facility] after patient signs Code 44 documentation.   11:27am-CSW received consult for hospice at home. CSW spoke with patient's daughter, Judeen Hammans. She reported agreement with adding hospice services to follow patient at home. She lives out of town but is staying with the patient today and tomorrow. CSW presented hospice options. Patient and Judeen Hammans have selected [facility]. CSW sent referral to [facility]. She will send info and speak with patient. If patient does not require DME delivered to home, patient can likely discharge home today.   Percell Locus Mattalyn Anderegg LCSW [phone]

## 2019-02-06 LAB — CULTURE, BLOOD (ROUTINE X 2)
Culture: NO GROWTH
Culture: NO GROWTH
Special Requests: ADEQUATE

## 2019-02-22 DEATH — deceased

## 2020-05-29 IMAGING — CT CT ANGIO CHEST
2 of 7 series · 17 of 46 positions shown · IV contrast (APPLIED)
Comparison: No priors.

CLINICAL DATA: 89-year-old female with history of shortness of
breath. Possible mass lesion noted on recent chest x-ray.

EXAM:
CT ANGIOGRAPHY CHEST WITH CONTRAST
TECHNIQUE: Multidetector CT imaging of the chest was performed using the
standard protocol during bolus administration of intravenous
contrast. Multiplanar CT image reconstructions and MIPs were
obtained to evaluate the vascular anatomy.
CONTRAST:  75mL OMNIPAQUE IOHEXOL 300 MG/ML  SOLN

[Series 6: thins · axial · 0.53mm/px · z∈[-273,-23]mm · 14 of 403 slices shown]
[im 23/403  lung]
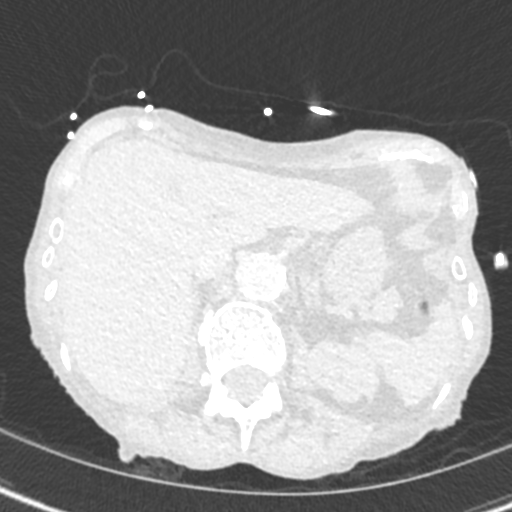
[im 45/403  soft-tissue]
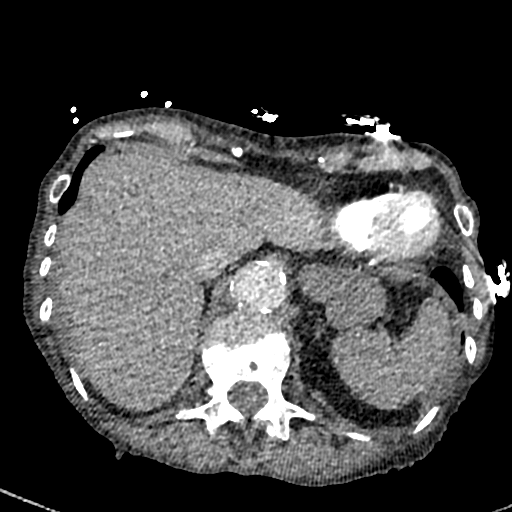
[im 90/403  lung]
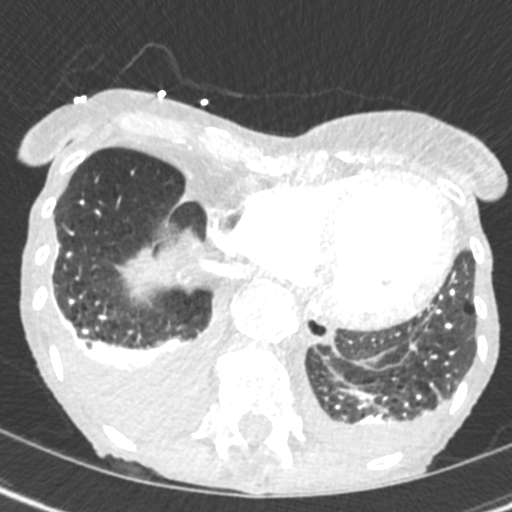
[im 112/403  soft-tissue]
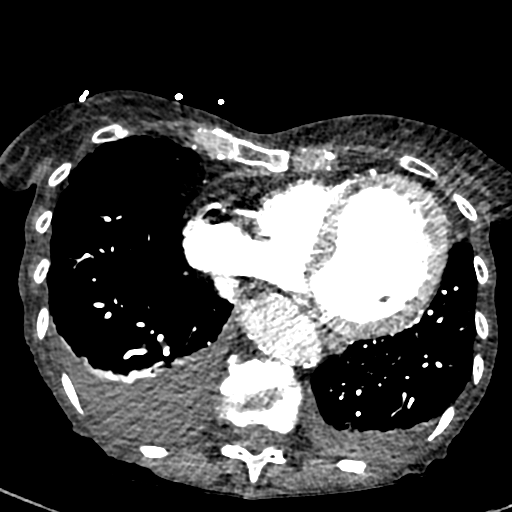
[im 135/403  lung]
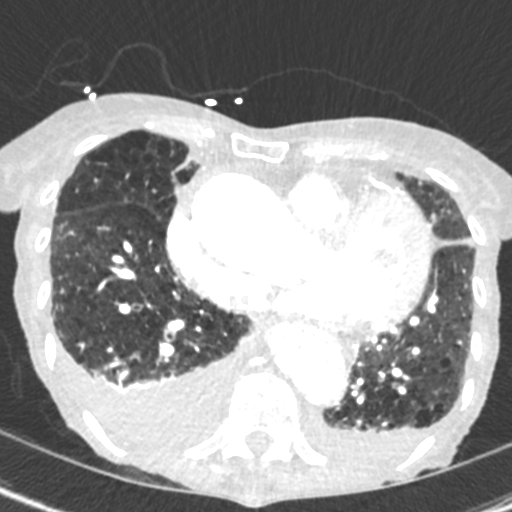
[im 157/403  soft-tissue]
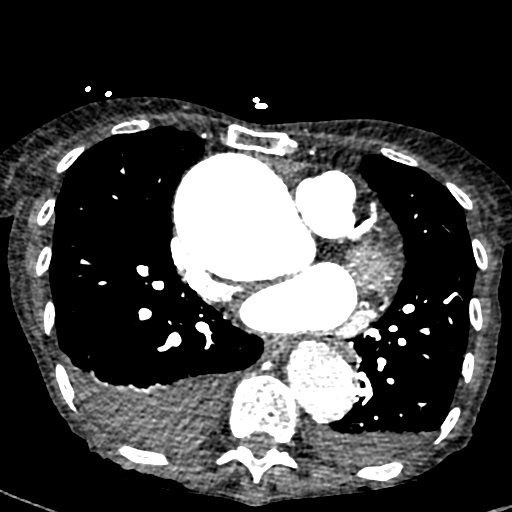
[im 179/403  lung]
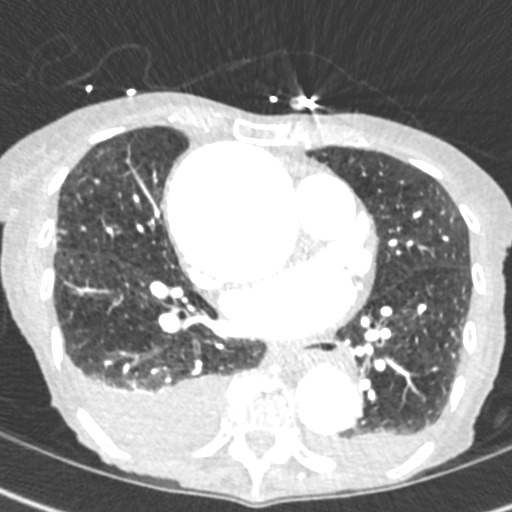
[im 224/403  soft-tissue]
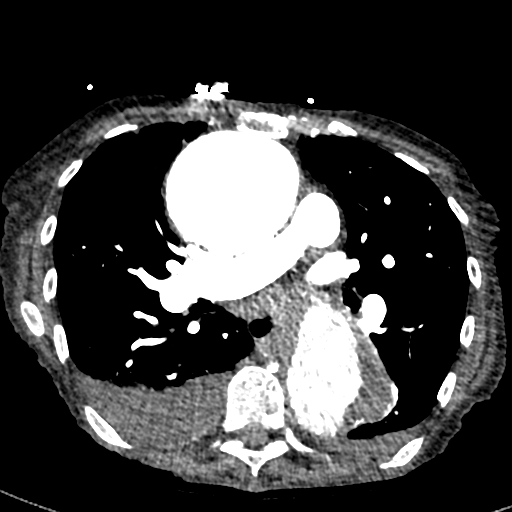
[im 246/403  lung]
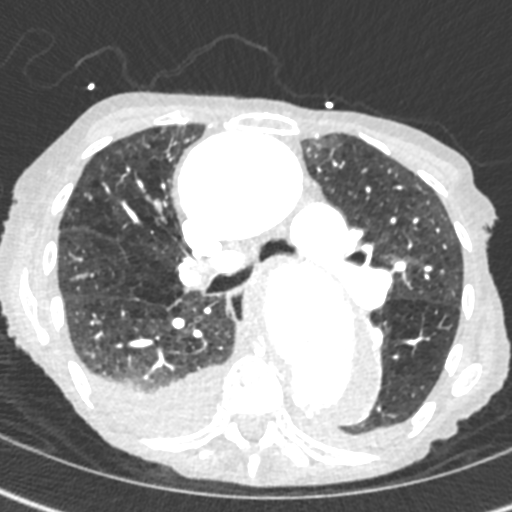
[im 269/403  soft-tissue]
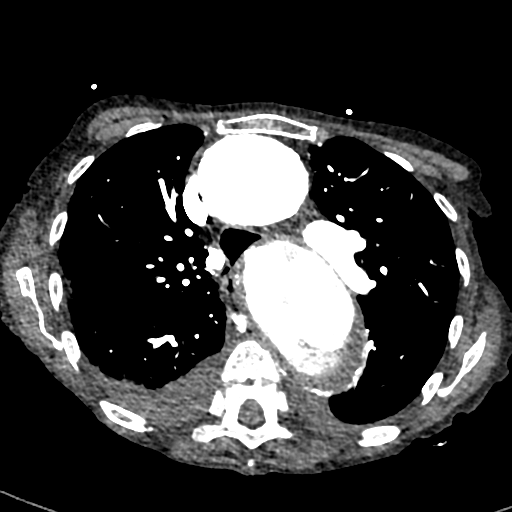
[im 291/403  lung]
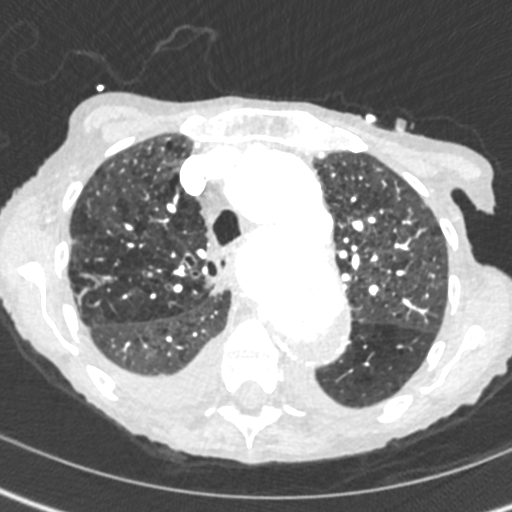
[im 313/403  soft-tissue]
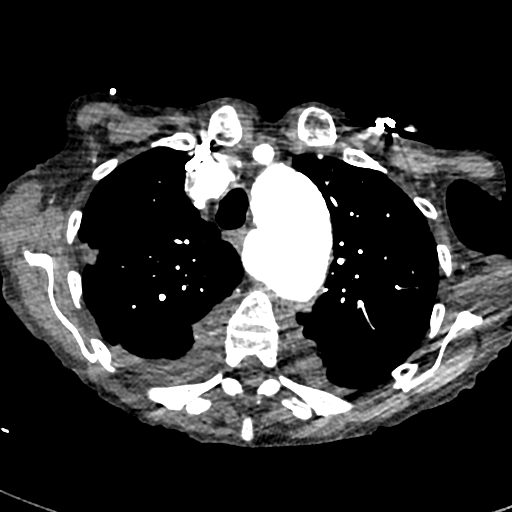
[im 358/403  lung]
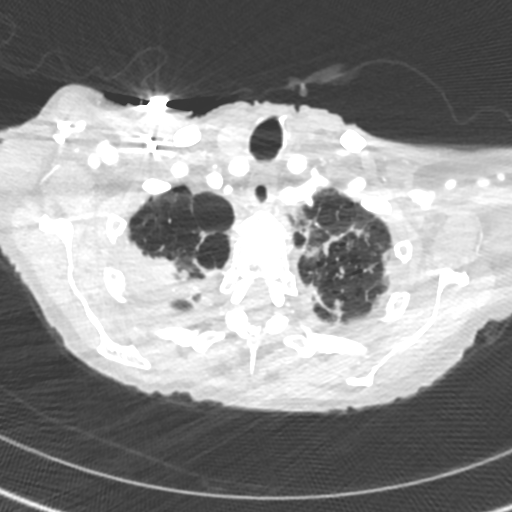
[im 380/403  soft-tissue]
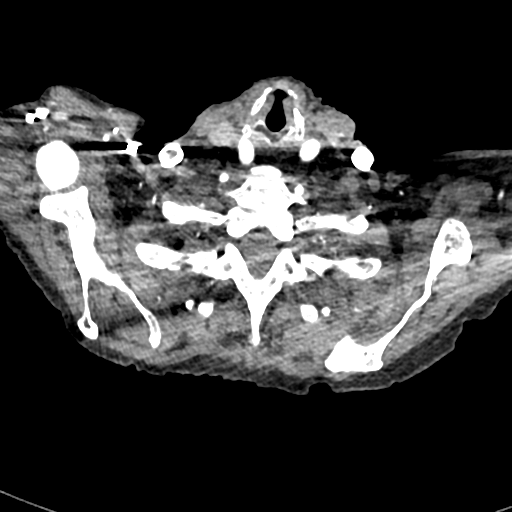

[Series 8: cor · coronal · 0.57mm/px · 3 of 121 slices shown]
[im 31/121  soft-tissue]
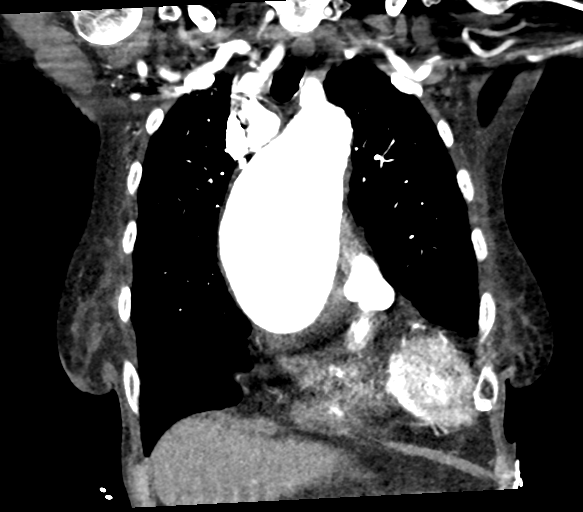
[im 61/121  soft-tissue]
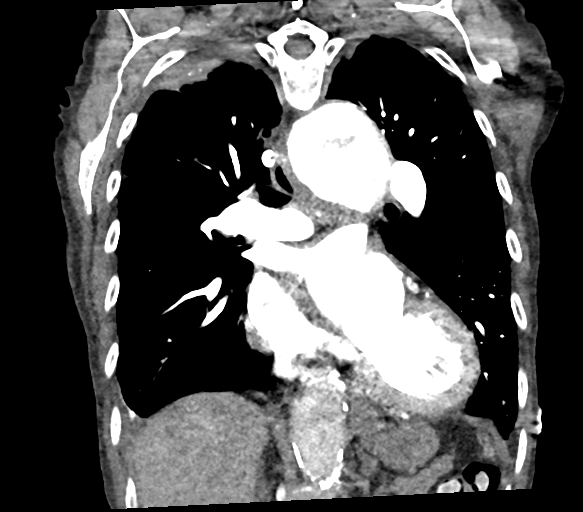
[im 91/121  soft-tissue]
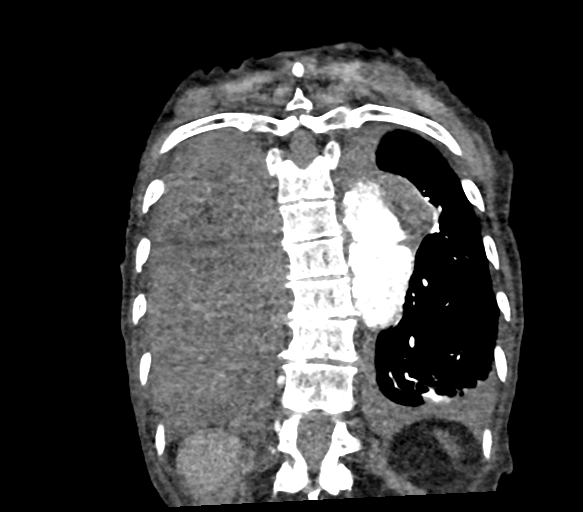

[17 of 46 positions shown; findings below may reference images not displayed]

FINDINGS: Cardiovascular: Heart size is normal. There is no significant
pericardial fluid, thickening or pericardial calcification. There is
aortic atherosclerosis, as well as atherosclerosis of the great
vessels of the mediastinum and the coronary arteries, including
calcified atherosclerotic plaque in the left main, left anterior
descending and right coronary arteries. Severe aneurysmal dilatation
of the thoracic aorta. Specifically, the ascending thoracic aorta
measures up to 7.1 cm in diameter, the mid aortic arch measures up
to 5.4 cm in diameter, and the proximal descending thoracic aorta
measures up to 5.8 cm in diameter. In the proximal to mid descending
thoracic aorta there is some eccentric nonenhancing material which
is favored to represent mural thrombus, although the possibility of
intramural hemorrhage is not entirely excluded (noncontrast images
were not obtained). Additionally, a short segment dissection with
occluded false lumen is not favored, but not entirely excluded.

Mediastinum/Nodes: No pathologically enlarged mediastinal or hilar
lymph nodes. Esophagus is unremarkable in appearance. No axillary
lymphadenopathy.

Lungs/Pleura: Moderate right and small left pleural effusions lying
dependently with some associated passive subsegmental atelectasis in
the dependent portions of the lungs bilaterally. In the apices of
the lungs there are extensive areas of pleuroparenchymal thickening
and architectural distortion, favored to reflect chronic post
infectious or inflammatory scarring. This is particularly mass-like
in the periphery of the left upper lobe where this measures up to
3.4 x 2.4 cm (axial image 20 of series 5). Some faint calcifications
are evident within these areas of probable scarring. No other
definite suspicious appearing pulmonary nodules or masses are noted.
Diffuse bronchial wall thickening with moderate centrilobular and
paraseptal emphysema. No acute consolidative airspace disease.

Upper Abdomen: 1 cm low-attenuation lesion in segment 4A, compatible
with a small cyst. Aortic atherosclerosis.

Musculoskeletal: There are no aggressive appearing lytic or blastic
lesions noted in the visualized portions of the skeleton.

Review of the MIP images confirms the above findings.
IMPRESSION: 1. Severe aneurysmal dilatation of the thoracic aorta involving the
ascending aorta, aortic arch and descending thoracic aorta,
including eccentric nonenhancing material in the descending thoracic
aorta which is favored to reflect the presence of mural thrombus.
Strictly speaking, the possibility of acute intramural hemorrhage or
short segment dissection with thrombosed false lumen is not entirely
excluded, but is not strongly favored, particularly in the absence
of a history of chest pain. Consultation with Thoracic surgery is
recommended to guide future management.
2. There is also left main and 2 vessel coronary artery disease.
3. Diffuse bronchial wall thickening with moderate centrilobular and
paraseptal emphysema; imaging findings suggestive of underlying
COPD.
4. Probable areas of chronic post infectious or inflammatory
scarring in the apices of the lungs bilaterally (right greater than
left). Attention on follow-up studies is recommended to ensure the
stability of these findings.

Aortic Atherosclerosis (G95LD-QJO.O). Emphysema (G95LD-9U6.A).
Aortic aneurysm NOS (G95LD-7W2.5).

## 2020-05-29 IMAGING — DX DG CHEST 1V PORT
1 series · 1 of 1 positions shown · non-contrast
Comparison: Chest x-ray 05/12/2009.

CLINICAL DATA: 89-year-old female with history of shortness of
breath for 1 week.

EXAM:
PORTABLE CHEST 1 VIEW

[chest ap]
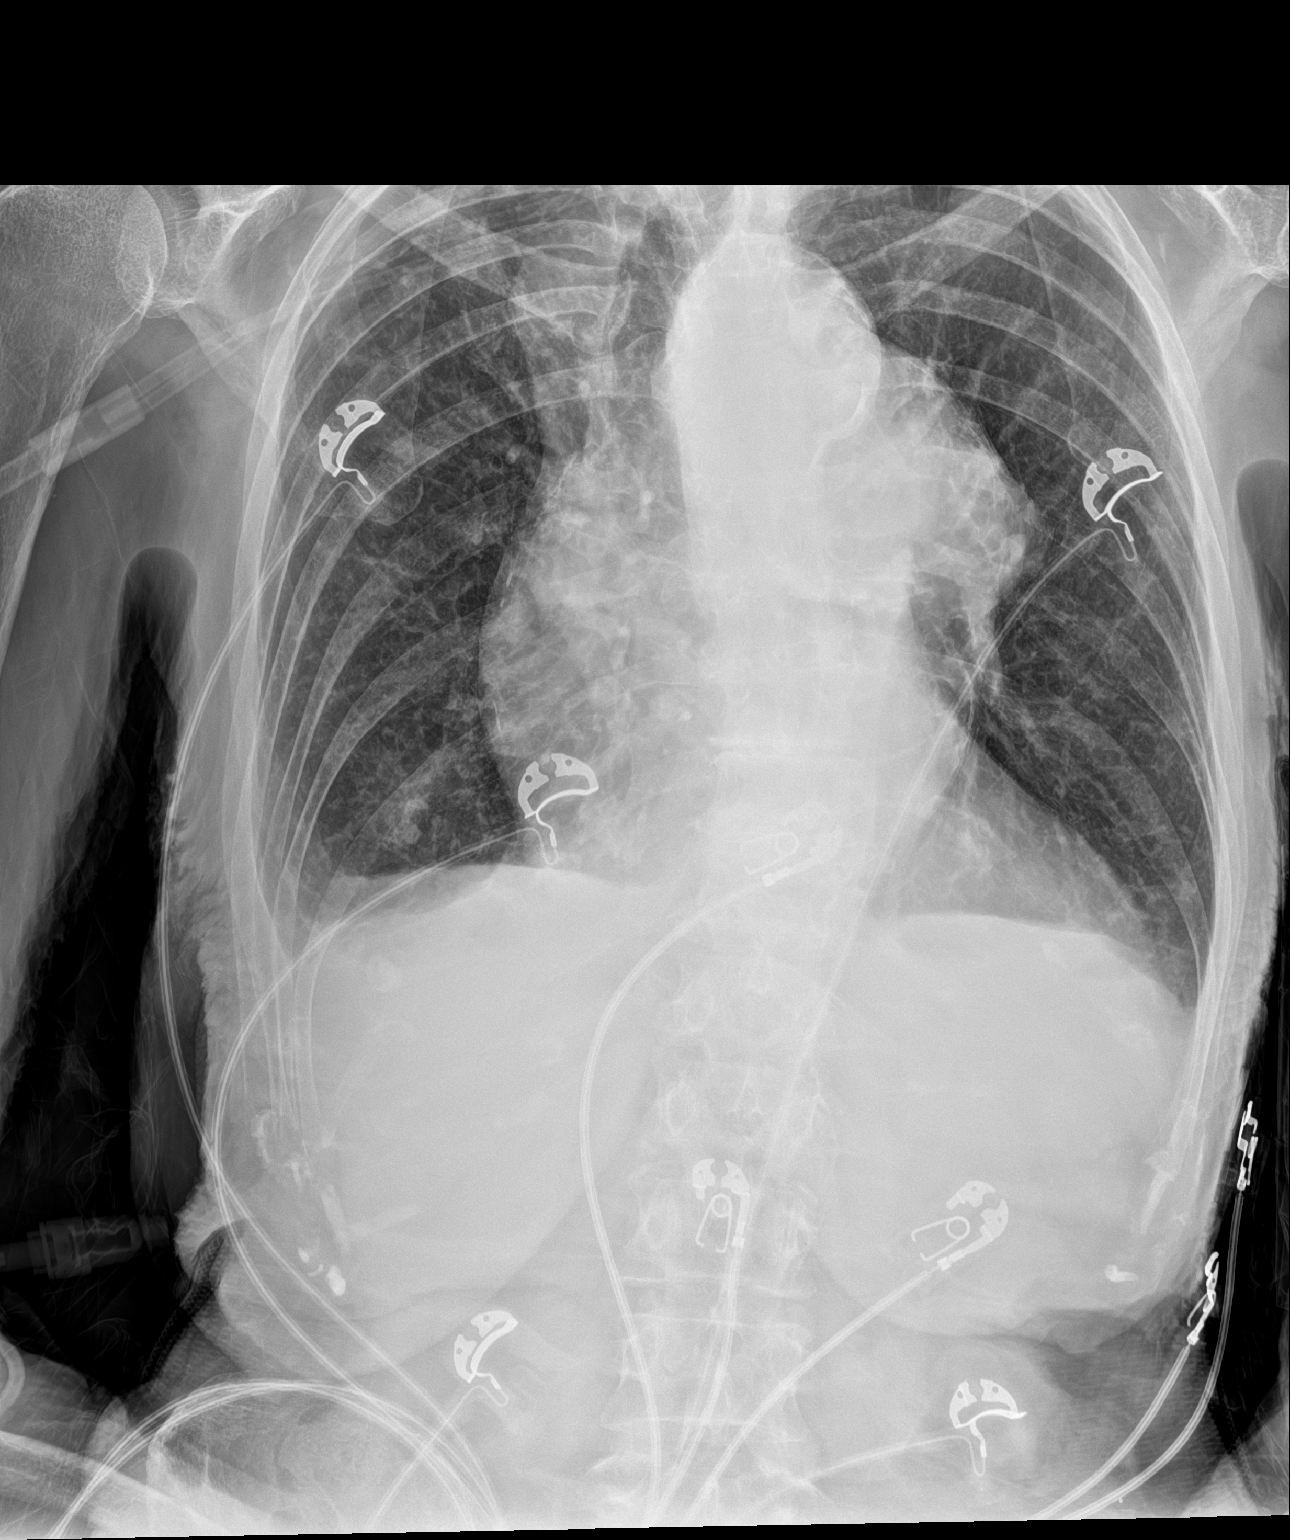

[1 of 1 positions shown; findings below may reference images not displayed]

FINDINGS: Lung volumes are normal. No acute consolidative airspace disease.
Small bilateral pleural effusions. No evidence of pulmonary edema.
Heart size is normal. Mass-like enlargement of the left hilar and
suprahilar region, potentially vascular in origin. Aortic
atherosclerosis.
IMPRESSION: 1. Unusual mass-like enlargement of the left hilar and suprahilar
region, potentially vascular in origin. However, the possibility of
underlying neoplasm is not excluded. Further evaluation with
contrast enhanced chest CT is recommended.
2. Small bilateral pleural effusions.
